# Patient Record
Sex: Female | Born: 1945
Health system: Southern US, Community
[De-identification: ages and names within clinical notes are randomized; demographics above are authoritative.]

## PROBLEM LIST (undated history)

## (undated) ENCOUNTER — Emergency Department (HOSPITAL_COMMUNITY): Payer: BC Managed Care – PPO | Source: Home / Self Care

## (undated) DIAGNOSIS — H3530 Unspecified macular degeneration: Secondary | ICD-10-CM

## (undated) DIAGNOSIS — M8580 Other specified disorders of bone density and structure, unspecified site: Secondary | ICD-10-CM

## (undated) DIAGNOSIS — E7800 Pure hypercholesterolemia, unspecified: Secondary | ICD-10-CM

## (undated) DIAGNOSIS — Z789 Other specified health status: Secondary | ICD-10-CM

## (undated) DIAGNOSIS — K635 Polyp of colon: Secondary | ICD-10-CM

## (undated) DIAGNOSIS — D649 Anemia, unspecified: Secondary | ICD-10-CM

## (undated) DIAGNOSIS — H269 Unspecified cataract: Secondary | ICD-10-CM

## (undated) DIAGNOSIS — A64 Unspecified sexually transmitted disease: Secondary | ICD-10-CM

## (undated) DIAGNOSIS — K5792 Diverticulitis of intestine, part unspecified, without perforation or abscess without bleeding: Secondary | ICD-10-CM

## (undated) HISTORY — DX: Anemia, unspecified: D64.9

## (undated) HISTORY — DX: Unspecified cataract: H26.9

## (undated) HISTORY — DX: Other specified disorders of bone density and structure, unspecified site: M85.80

## (undated) HISTORY — PX: COLONOSCOPY: SHX174

## (undated) HISTORY — PX: COSMETIC SURGERY: SHX468

## (undated) HISTORY — PX: BACK SURGERY: SHX140

## (undated) HISTORY — DX: Unspecified sexually transmitted disease: A64

## (undated) HISTORY — PX: CATARACT EXTRACTION: SUR2

## (undated) HISTORY — DX: Polyp of colon: K63.5

## (undated) HISTORY — DX: Unspecified macular degeneration: H35.30

## (undated) HISTORY — DX: Diverticulitis of intestine, part unspecified, without perforation or abscess without bleeding: K57.92

## (undated) HISTORY — DX: Pure hypercholesterolemia, unspecified: E78.00

---

## 1997-09-13 ENCOUNTER — Other Ambulatory Visit: Admission: RE | Admit: 1997-09-13 | Discharge: 1997-09-13 | Payer: Self-pay | Admitting: Obstetrics and Gynecology

## 1998-10-11 ENCOUNTER — Other Ambulatory Visit: Admission: RE | Admit: 1998-10-11 | Discharge: 1998-10-11 | Payer: Self-pay | Admitting: Obstetrics and Gynecology

## 1998-11-27 ENCOUNTER — Ambulatory Visit (HOSPITAL_COMMUNITY): Admission: RE | Admit: 1998-11-27 | Discharge: 1998-11-27 | Payer: Self-pay | Admitting: Neurosurgery

## 1998-11-27 ENCOUNTER — Encounter: Payer: Self-pay | Admitting: Neurosurgery

## 1999-10-16 ENCOUNTER — Other Ambulatory Visit: Admission: RE | Admit: 1999-10-16 | Discharge: 1999-10-16 | Payer: Self-pay | Admitting: Obstetrics and Gynecology

## 2000-06-09 ENCOUNTER — Ambulatory Visit (HOSPITAL_COMMUNITY): Admission: RE | Admit: 2000-06-09 | Discharge: 2000-06-09 | Payer: Self-pay | Admitting: Gastroenterology

## 2000-06-24 ENCOUNTER — Encounter: Payer: Self-pay | Admitting: Obstetrics and Gynecology

## 2000-06-24 ENCOUNTER — Encounter: Admission: RE | Admit: 2000-06-24 | Discharge: 2000-06-24 | Payer: Self-pay | Admitting: Obstetrics and Gynecology

## 2000-11-05 ENCOUNTER — Other Ambulatory Visit: Admission: RE | Admit: 2000-11-05 | Discharge: 2000-11-05 | Payer: Self-pay | Admitting: Obstetrics and Gynecology

## 2000-11-08 ENCOUNTER — Other Ambulatory Visit: Admission: RE | Admit: 2000-11-08 | Discharge: 2000-11-08 | Payer: Self-pay | Admitting: Obstetrics and Gynecology

## 2001-07-14 ENCOUNTER — Encounter: Admission: RE | Admit: 2001-07-14 | Discharge: 2001-07-14 | Payer: Self-pay | Admitting: Obstetrics and Gynecology

## 2001-07-14 ENCOUNTER — Encounter: Payer: Self-pay | Admitting: Obstetrics and Gynecology

## 2001-10-31 ENCOUNTER — Other Ambulatory Visit: Admission: RE | Admit: 2001-10-31 | Discharge: 2001-10-31 | Payer: Self-pay | Admitting: Obstetrics and Gynecology

## 2002-07-18 ENCOUNTER — Encounter: Admission: RE | Admit: 2002-07-18 | Discharge: 2002-07-18 | Payer: Self-pay | Admitting: Obstetrics and Gynecology

## 2002-07-18 ENCOUNTER — Encounter: Payer: Self-pay | Admitting: Obstetrics and Gynecology

## 2002-11-02 ENCOUNTER — Other Ambulatory Visit: Admission: RE | Admit: 2002-11-02 | Discharge: 2002-11-02 | Payer: Self-pay | Admitting: Obstetrics and Gynecology

## 2002-11-20 ENCOUNTER — Encounter: Admission: RE | Admit: 2002-11-20 | Discharge: 2002-11-20 | Payer: Self-pay | Admitting: Obstetrics and Gynecology

## 2002-11-20 ENCOUNTER — Encounter: Payer: Self-pay | Admitting: Obstetrics and Gynecology

## 2003-07-24 ENCOUNTER — Encounter: Admission: RE | Admit: 2003-07-24 | Discharge: 2003-07-24 | Payer: Self-pay | Admitting: Obstetrics and Gynecology

## 2003-11-12 ENCOUNTER — Other Ambulatory Visit: Admission: RE | Admit: 2003-11-12 | Discharge: 2003-11-12 | Payer: Self-pay | Admitting: Obstetrics and Gynecology

## 2004-07-18 ENCOUNTER — Ambulatory Visit (HOSPITAL_COMMUNITY): Admission: RE | Admit: 2004-07-18 | Discharge: 2004-07-18 | Payer: Self-pay | Admitting: Obstetrics and Gynecology

## 2004-11-12 ENCOUNTER — Other Ambulatory Visit: Admission: RE | Admit: 2004-11-12 | Discharge: 2004-11-12 | Payer: Self-pay | Admitting: *Deleted

## 2005-07-28 ENCOUNTER — Encounter: Admission: RE | Admit: 2005-07-28 | Discharge: 2005-07-28 | Payer: Self-pay | Admitting: Obstetrics and Gynecology

## 2005-08-13 ENCOUNTER — Encounter: Admission: RE | Admit: 2005-08-13 | Discharge: 2005-08-13 | Payer: Self-pay | Admitting: Obstetrics and Gynecology

## 2005-11-16 ENCOUNTER — Other Ambulatory Visit: Admission: RE | Admit: 2005-11-16 | Discharge: 2005-11-16 | Payer: Self-pay | Admitting: Obstetrics & Gynecology

## 2005-11-17 ENCOUNTER — Ambulatory Visit (HOSPITAL_COMMUNITY): Admission: RE | Admit: 2005-11-17 | Discharge: 2005-11-17 | Payer: Self-pay | Admitting: Obstetrics & Gynecology

## 2006-07-29 ENCOUNTER — Encounter: Admission: RE | Admit: 2006-07-29 | Discharge: 2006-07-29 | Payer: Self-pay | Admitting: Obstetrics & Gynecology

## 2006-11-19 ENCOUNTER — Other Ambulatory Visit: Admission: RE | Admit: 2006-11-19 | Discharge: 2006-11-19 | Payer: Self-pay | Admitting: Obstetrics & Gynecology

## 2007-08-11 ENCOUNTER — Encounter: Admission: RE | Admit: 2007-08-11 | Discharge: 2007-08-11 | Payer: Self-pay | Admitting: Obstetrics & Gynecology

## 2007-11-30 ENCOUNTER — Other Ambulatory Visit: Admission: RE | Admit: 2007-11-30 | Discharge: 2007-11-30 | Payer: Self-pay | Admitting: Obstetrics & Gynecology

## 2007-12-05 ENCOUNTER — Encounter: Admission: RE | Admit: 2007-12-05 | Discharge: 2007-12-05 | Payer: Self-pay | Admitting: Obstetrics & Gynecology

## 2008-08-13 ENCOUNTER — Encounter: Admission: RE | Admit: 2008-08-13 | Discharge: 2008-08-13 | Payer: Self-pay | Admitting: Obstetrics & Gynecology

## 2009-08-14 ENCOUNTER — Encounter: Admission: RE | Admit: 2009-08-14 | Discharge: 2009-08-14 | Payer: Self-pay | Admitting: Obstetrics & Gynecology

## 2010-08-15 ENCOUNTER — Encounter
Admission: RE | Admit: 2010-08-15 | Discharge: 2010-08-15 | Payer: Self-pay | Source: Home / Self Care | Attending: Internal Medicine | Admitting: Internal Medicine

## 2011-07-06 ENCOUNTER — Other Ambulatory Visit: Payer: Self-pay | Admitting: Internal Medicine

## 2011-07-06 DIAGNOSIS — Z1231 Encounter for screening mammogram for malignant neoplasm of breast: Secondary | ICD-10-CM

## 2011-08-18 ENCOUNTER — Ambulatory Visit
Admission: RE | Admit: 2011-08-18 | Discharge: 2011-08-18 | Disposition: A | Discharge status: Home / Self Care | Payer: BC Managed Care – PPO | Source: Ambulatory Visit | Attending: Internal Medicine | Admitting: Internal Medicine

## 2011-08-18 DIAGNOSIS — Z1231 Encounter for screening mammogram for malignant neoplasm of breast: Secondary | ICD-10-CM

## 2012-04-18 ENCOUNTER — Other Ambulatory Visit: Payer: Self-pay | Admitting: Internal Medicine

## 2012-04-18 DIAGNOSIS — M546 Pain in thoracic spine: Secondary | ICD-10-CM

## 2012-04-22 ENCOUNTER — Ambulatory Visit
Admission: RE | Admit: 2012-04-22 | Discharge: 2012-04-22 | Disposition: A | Discharge status: Home / Self Care | Payer: BC Managed Care – PPO | Source: Ambulatory Visit | Attending: Internal Medicine | Admitting: Internal Medicine

## 2012-04-22 DIAGNOSIS — M546 Pain in thoracic spine: Secondary | ICD-10-CM

## 2012-04-29 ENCOUNTER — Encounter (HOSPITAL_COMMUNITY): Payer: Self-pay | Admitting: Pharmacy Technician

## 2012-04-29 ENCOUNTER — Encounter (HOSPITAL_COMMUNITY): Payer: Self-pay | Admitting: *Deleted

## 2012-04-29 ENCOUNTER — Other Ambulatory Visit: Payer: Self-pay | Admitting: Internal Medicine

## 2012-04-29 ENCOUNTER — Other Ambulatory Visit: Payer: Self-pay | Admitting: Neurosurgery

## 2012-04-29 DIAGNOSIS — M8580 Other specified disorders of bone density and structure, unspecified site: Secondary | ICD-10-CM

## 2012-04-30 ENCOUNTER — Encounter (HOSPITAL_COMMUNITY)
Admission: RE | Disposition: A | Discharge status: Home / Self Care | Payer: Self-pay | Source: Ambulatory Visit | Attending: Neurosurgery

## 2012-04-30 ENCOUNTER — Observation Stay (HOSPITAL_COMMUNITY)
Admission: RE | Admit: 2012-04-30 | Discharge: 2012-05-01 | Disposition: A | Discharge status: Home / Self Care | Payer: BC Managed Care – PPO | Source: Ambulatory Visit | Attending: Neurosurgery | Admitting: Neurosurgery

## 2012-04-30 ENCOUNTER — Encounter (HOSPITAL_COMMUNITY): Payer: Self-pay | Admitting: *Deleted

## 2012-04-30 ENCOUNTER — Ambulatory Visit (HOSPITAL_COMMUNITY): Payer: BC Managed Care – PPO | Admitting: *Deleted

## 2012-04-30 ENCOUNTER — Ambulatory Visit (HOSPITAL_COMMUNITY): Payer: BC Managed Care – PPO

## 2012-04-30 DIAGNOSIS — M5124 Other intervertebral disc displacement, thoracic region: Principal | ICD-10-CM | POA: Insufficient documentation

## 2012-04-30 HISTORY — DX: Other specified health status: Z78.9

## 2012-04-30 LAB — CBC
HCT: 39 % (ref 36.0–46.0)
Hemoglobin: 13 g/dL (ref 12.0–15.0)
MCH: 30 pg (ref 26.0–34.0)
MCHC: 33.3 g/dL (ref 30.0–36.0)
MCV: 89.9 fL (ref 78.0–100.0)
Platelets: 434 10*3/uL — ABNORMAL HIGH (ref 150–400)
RBC: 4.34 MIL/uL (ref 3.87–5.11)
RDW: 12.8 % (ref 11.5–15.5)
WBC: 8.5 10*3/uL (ref 4.0–10.5)

## 2012-04-30 LAB — SURGICAL PCR SCREEN
MRSA, PCR: NEGATIVE
Staphylococcus aureus: NEGATIVE

## 2012-04-30 SURGERY — THORACIC DISCECTOMY
Anesthesia: General | Site: Back | Laterality: Left | Wound class: Clean

## 2012-04-30 MED ORDER — ACETAMINOPHEN 10 MG/ML IV SOLN
1000.0000 mg | Freq: Four times a day (QID) | INTRAVENOUS | Status: AC
  Administered 2012-04-30 – 2012-05-01 (×4): 1000 mg via INTRAVENOUS
  Filled 2012-04-30 (×7): qty 100

## 2012-04-30 MED ORDER — THROMBIN 5000 UNITS EX SOLR
CUTANEOUS | Status: DC | PRN
  Administered 2012-04-30 (×2): 5000 [IU] via TOPICAL

## 2012-04-30 MED ORDER — KCL IN DEXTROSE-NACL 20-5-0.45 MEQ/L-%-% IV SOLN
INTRAVENOUS | Status: DC
  Administered 2012-04-30: 999 mL via INTRAVENOUS
  Filled 2012-04-30 (×4): qty 1000

## 2012-04-30 MED ORDER — PROPOFOL 10 MG/ML IV BOLUS
INTRAVENOUS | Status: DC | PRN
  Administered 2012-04-30: 150 mg via INTRAVENOUS

## 2012-04-30 MED ORDER — VANCOMYCIN HCL IN DEXTROSE 1-5 GM/200ML-% IV SOLN
1000.0000 mg | Freq: Once | INTRAVENOUS | Status: AC
  Administered 2012-04-30: 1000 mg via INTRAVENOUS

## 2012-04-30 MED ORDER — MUPIROCIN 2 % EX OINT
TOPICAL_OINTMENT | Freq: Once | CUTANEOUS | Status: AC
  Administered 2012-04-30: 1 via NASAL
  Filled 2012-04-30: qty 22

## 2012-04-30 MED ORDER — OXYCODONE HCL 5 MG PO TABS: 5.0000 mg | ORAL_TABLET | ORAL | Status: DC | PRN

## 2012-04-30 MED ORDER — PROMETHAZINE HCL 25 MG/ML IJ SOLN
INTRAMUSCULAR | Status: AC
  Filled 2012-04-30: qty 1

## 2012-04-30 MED ORDER — ACETAMINOPHEN 650 MG RE SUPP: 650.0000 mg | RECTAL | Status: DC | PRN

## 2012-04-30 MED ORDER — LIDOCAINE-EPINEPHRINE 1 %-1:100000 IJ SOLN
INTRAMUSCULAR | Status: DC | PRN
  Administered 2012-04-30: 15 mL via INTRADERMAL

## 2012-04-30 MED ORDER — SODIUM CHLORIDE 0.9 % IR SOLN
Status: DC | PRN
  Administered 2012-04-30: 12:00:00

## 2012-04-30 MED ORDER — 0.9 % SODIUM CHLORIDE (POUR BTL) OPTIME
TOPICAL | Status: DC | PRN
  Administered 2012-04-30: 1000 mL

## 2012-04-30 MED ORDER — PROGESTERONE MICRONIZED 100 MG PO CAPS
100.0000 mg | ORAL_CAPSULE | Freq: Every day | ORAL | Status: DC
  Administered 2012-04-30: 100 mg via ORAL
  Filled 2012-04-30 (×2): qty 1

## 2012-04-30 MED ORDER — CYCLOBENZAPRINE HCL 10 MG PO TABS: 10.0000 mg | ORAL_TABLET | Freq: Three times a day (TID) | ORAL | Status: DC | PRN

## 2012-04-30 MED ORDER — SODIUM CHLORIDE 0.9 % IV SOLN
INTRAVENOUS | Status: AC
  Filled 2012-04-30: qty 500

## 2012-04-30 MED ORDER — ONDANSETRON HCL 4 MG/2ML IJ SOLN
INTRAMUSCULAR | Status: DC | PRN
  Administered 2012-04-30: 4 mg via INTRAVENOUS

## 2012-04-30 MED ORDER — HEMOSTATIC AGENTS (NO CHARGE) OPTIME
TOPICAL | Status: DC | PRN
  Administered 2012-04-30: 1 via TOPICAL

## 2012-04-30 MED ORDER — EPHEDRINE SULFATE 50 MG/ML IJ SOLN
INTRAMUSCULAR | Status: DC | PRN
  Administered 2012-04-30 (×3): 10 mg via INTRAVENOUS

## 2012-04-30 MED ORDER — HYDROXYZINE HCL 25 MG PO TABS: 50.0000 mg | ORAL_TABLET | ORAL | Status: DC | PRN

## 2012-04-30 MED ORDER — HYDROMORPHONE HCL PF 1 MG/ML IJ SOLN
INTRAMUSCULAR | Status: AC
  Filled 2012-04-30: qty 1

## 2012-04-30 MED ORDER — BUPIVACAINE HCL (PF) 0.5 % IJ SOLN
INTRAMUSCULAR | Status: DC | PRN
  Administered 2012-04-30: 15 mL

## 2012-04-30 MED ORDER — PHENYLEPHRINE HCL 10 MG/ML IJ SOLN
INTRAMUSCULAR | Status: DC | PRN
  Administered 2012-04-30 (×2): 120 ug via INTRAVENOUS
  Administered 2012-04-30 (×4): 80 ug via INTRAVENOUS

## 2012-04-30 MED ORDER — MIDAZOLAM HCL 5 MG/5ML IJ SOLN
INTRAMUSCULAR | Status: DC | PRN
  Administered 2012-04-30: 2 mg via INTRAVENOUS

## 2012-04-30 MED ORDER — FENTANYL CITRATE 0.05 MG/ML IJ SOLN
INTRAMUSCULAR | Status: DC | PRN
  Administered 2012-04-30: 250 ug via INTRAVENOUS
  Administered 2012-04-30 (×2): 50 ug via INTRAVENOUS

## 2012-04-30 MED ORDER — VANCOMYCIN HCL IN DEXTROSE 1-5 GM/200ML-% IV SOLN
INTRAVENOUS | Status: AC
  Filled 2012-04-30: qty 200

## 2012-04-30 MED ORDER — ESTRADIOL 1 MG PO TABS
0.5000 mg | ORAL_TABLET | Freq: Every day | ORAL | Status: DC
  Administered 2012-04-30: 0.5 mg via ORAL
  Filled 2012-04-30 (×2): qty 0.5

## 2012-04-30 MED ORDER — SODIUM CHLORIDE 0.9 % IJ SOLN: 3.0000 mL | INTRAMUSCULAR | Status: DC | PRN

## 2012-04-30 MED ORDER — ZOLPIDEM TARTRATE 5 MG PO TABS: 5.0000 mg | ORAL_TABLET | Freq: Every evening | ORAL | Status: DC | PRN

## 2012-04-30 MED ORDER — BACITRACIN 50000 UNITS IM SOLR
INTRAMUSCULAR | Status: AC
  Filled 2012-04-30: qty 1

## 2012-04-30 MED ORDER — ACETAMINOPHEN 10 MG/ML IV SOLN
INTRAVENOUS | Status: AC
  Filled 2012-04-30: qty 100

## 2012-04-30 MED ORDER — FAMOTIDINE IN NACL 20-0.9 MG/50ML-% IV SOLN: 20.0000 mg | Freq: Once | INTRAVENOUS | Status: DC

## 2012-04-30 MED ORDER — MORPHINE SULFATE 4 MG/ML IJ SOLN: 4.0000 mg | INTRAMUSCULAR | Status: DC | PRN

## 2012-04-30 MED ORDER — ALUM & MAG HYDROXIDE-SIMETH 200-200-20 MG/5ML PO SUSP: 30.0000 mL | Freq: Four times a day (QID) | ORAL | Status: DC | PRN

## 2012-04-30 MED ORDER — HYDROXYZINE HCL 50 MG/ML IM SOLN
50.0000 mg | INTRAMUSCULAR | Status: DC | PRN
  Filled 2012-04-30: qty 1

## 2012-04-30 MED ORDER — MENTHOL 3 MG MT LOZG: 1.0000 | LOZENGE | OROMUCOSAL | Status: DC | PRN

## 2012-04-30 MED ORDER — DEXAMETHASONE 4 MG PO TABS
4.0000 mg | ORAL_TABLET | Freq: Three times a day (TID) | ORAL | Status: AC
  Administered 2012-04-30 – 2012-05-01 (×3): 4 mg via ORAL
  Filled 2012-04-30 (×3): qty 1

## 2012-04-30 MED ORDER — SODIUM CHLORIDE 0.9 % IJ SOLN
3.0000 mL | Freq: Two times a day (BID) | INTRAMUSCULAR | Status: DC
  Administered 2012-04-30 (×2): 3 mL via INTRAVENOUS

## 2012-04-30 MED ORDER — DEXAMETHASONE SODIUM PHOSPHATE 10 MG/ML IJ SOLN
INTRAMUSCULAR | Status: AC
  Administered 2012-04-30: 10 mg via INTRAVENOUS
  Filled 2012-04-30: qty 1

## 2012-04-30 MED ORDER — PANTOPRAZOLE SODIUM 40 MG PO TBEC
40.0000 mg | DELAYED_RELEASE_TABLET | Freq: Every day | ORAL | Status: DC
  Administered 2012-04-30: 40 mg via ORAL
  Filled 2012-04-30 (×2): qty 1

## 2012-04-30 MED ORDER — GLYCOPYRROLATE 0.2 MG/ML IJ SOLN
INTRAMUSCULAR | Status: DC | PRN
  Administered 2012-04-30: .5 mg via INTRAVENOUS

## 2012-04-30 MED ORDER — KETOROLAC TROMETHAMINE 30 MG/ML IJ SOLN
30.0000 mg | Freq: Four times a day (QID) | INTRAMUSCULAR | Status: DC
  Administered 2012-04-30 – 2012-05-01 (×3): 30 mg via INTRAVENOUS
  Filled 2012-04-30 (×7): qty 1

## 2012-04-30 MED ORDER — LIDOCAINE HCL (CARDIAC) 20 MG/ML IV SOLN
INTRAVENOUS | Status: DC | PRN
  Administered 2012-04-30: 25 mg via INTRAVENOUS

## 2012-04-30 MED ORDER — PHENOL 1.4 % MT LIQD: 1.0000 | OROMUCOSAL | Status: DC | PRN

## 2012-04-30 MED ORDER — NEOSTIGMINE METHYLSULFATE 1 MG/ML IJ SOLN
INTRAMUSCULAR | Status: DC | PRN
  Administered 2012-04-30: 2.5 mg via INTRAVENOUS

## 2012-04-30 MED ORDER — GENTAMICIN IN SALINE 1.6-0.9 MG/ML-% IV SOLN
80.0000 mg | Freq: Once | INTRAVENOUS | Status: AC
  Administered 2012-04-30: 80 mg via INTRAVENOUS
  Filled 2012-04-30: qty 50

## 2012-04-30 MED ORDER — MIDAZOLAM HCL 2 MG/2ML IJ SOLN: 0.5000 mg | Freq: Once | INTRAMUSCULAR | Status: DC | PRN

## 2012-04-30 MED ORDER — MAGNESIUM HYDROXIDE 400 MG/5ML PO SUSP: 30.0000 mL | Freq: Every day | ORAL | Status: DC | PRN

## 2012-04-30 MED ORDER — LACTATED RINGERS IV SOLN
INTRAVENOUS | Status: DC | PRN
  Administered 2012-04-30 (×2): via INTRAVENOUS

## 2012-04-30 MED ORDER — FAMOTIDINE IN NACL 20-0.9 MG/50ML-% IV SOLN
20.0000 mg | Freq: Once | INTRAVENOUS | Status: AC
  Administered 2012-04-30: 20 mg via INTRAVENOUS
  Filled 2012-04-30: qty 50

## 2012-04-30 MED ORDER — PROMETHAZINE HCL 25 MG/ML IJ SOLN
6.2500 mg | INTRAMUSCULAR | Status: DC | PRN
  Administered 2012-04-30: 6.25 mg via INTRAVENOUS

## 2012-04-30 MED ORDER — MEPERIDINE HCL 25 MG/ML IJ SOLN: 6.2500 mg | INTRAMUSCULAR | Status: DC | PRN

## 2012-04-30 MED ORDER — KETOROLAC TROMETHAMINE 30 MG/ML IJ SOLN
30.0000 mg | Freq: Once | INTRAMUSCULAR | Status: AC
  Administered 2012-04-30: 30 mg via INTRAVENOUS

## 2012-04-30 MED ORDER — KETOROLAC TROMETHAMINE 30 MG/ML IJ SOLN
INTRAMUSCULAR | Status: AC
  Filled 2012-04-30: qty 1

## 2012-04-30 MED ORDER — SODIUM CHLORIDE 0.9 % IV SOLN: 250.0000 mL | INTRAVENOUS | Status: DC

## 2012-04-30 MED ORDER — BISACODYL 10 MG RE SUPP: 10.0000 mg | Freq: Every day | RECTAL | Status: DC | PRN

## 2012-04-30 MED ORDER — HYDROMORPHONE HCL PF 1 MG/ML IJ SOLN
0.2500 mg | INTRAMUSCULAR | Status: DC | PRN
  Administered 2012-04-30: 0.25 mg via INTRAVENOUS

## 2012-04-30 MED ORDER — ZOLPIDEM TARTRATE 10 MG PO TABS: 10.0000 mg | ORAL_TABLET | Freq: Every evening | ORAL | Status: DC | PRN

## 2012-04-30 MED ORDER — ROCURONIUM BROMIDE 100 MG/10ML IV SOLN
INTRAVENOUS | Status: DC | PRN
  Administered 2012-04-30: 50 mg via INTRAVENOUS
  Administered 2012-04-30 (×2): 10 mg via INTRAVENOUS

## 2012-04-30 MED ORDER — KCL IN DEXTROSE-NACL 20-5-0.45 MEQ/L-%-% IV SOLN
INTRAVENOUS | Status: AC
  Filled 2012-04-30: qty 1000

## 2012-04-30 MED ORDER — ACETAMINOPHEN 325 MG PO TABS: 650.0000 mg | ORAL_TABLET | ORAL | Status: DC | PRN

## 2012-04-30 SURGICAL SUPPLY — 52 items
ADH SKN CLS APL DERMABOND .7 (GAUZE/BANDAGES/DRESSINGS) ×1
APL SKNCLS STERI-STRIP NONHPOA (GAUZE/BANDAGES/DRESSINGS)
BAG DECANTER FOR FLEXI CONT (MISCELLANEOUS) ×2 IMPLANT
BENZOIN TINCTURE PRP APPL 2/3 (GAUZE/BANDAGES/DRESSINGS) IMPLANT
BIT DRILL NEURO 2X3.1 SFT TUCH (MISCELLANEOUS) ×1 IMPLANT
BLADE SURG ROTATE 9660 (MISCELLANEOUS) IMPLANT
BUR ROUND FLUTED 5 RND (BURR) ×2 IMPLANT
CANISTER SUCTION 2500CC (MISCELLANEOUS) ×2 IMPLANT
CLOTH BEACON ORANGE TIMEOUT ST (SAFETY) ×2 IMPLANT
CONT SPEC 4OZ CLIKSEAL STRL BL (MISCELLANEOUS) IMPLANT
DERMABOND ADVANCED (GAUZE/BANDAGES/DRESSINGS) ×1
DERMABOND ADVANCED .7 DNX12 (GAUZE/BANDAGES/DRESSINGS) ×1 IMPLANT
DRAPE LAPAROTOMY 100X72X124 (DRAPES) ×2 IMPLANT
DRAPE MICROSCOPE LEICA (MISCELLANEOUS) ×2 IMPLANT
DRAPE POUCH INSTRU U-SHP 10X18 (DRAPES) ×2 IMPLANT
DRILL NEURO 2X3.1 SOFT TOUCH (MISCELLANEOUS) ×2
ELECT REM PT RETURN 9FT ADLT (ELECTROSURGICAL) ×2
ELECTRODE REM PT RTRN 9FT ADLT (ELECTROSURGICAL) ×1 IMPLANT
GAUZE SPONGE 4X4 16PLY XRAY LF (GAUZE/BANDAGES/DRESSINGS) IMPLANT
GLOVE BIOGEL M 6.5 STRL (GLOVE) ×1 IMPLANT
GLOVE BIOGEL M 7.0 STRL (GLOVE) ×2 IMPLANT
GLOVE BIOGEL PI IND STRL 8 (GLOVE) ×1 IMPLANT
GLOVE BIOGEL PI INDICATOR 8 (GLOVE) ×1
GLOVE ECLIPSE 7.5 STRL STRAW (GLOVE) ×2 IMPLANT
GLOVE EXAM NITRILE LRG STRL (GLOVE) IMPLANT
GLOVE EXAM NITRILE MD LF STRL (GLOVE) IMPLANT
GLOVE EXAM NITRILE XL STR (GLOVE) IMPLANT
GLOVE EXAM NITRILE XS STR PU (GLOVE) IMPLANT
GOWN BRE IMP SLV AUR LG STRL (GOWN DISPOSABLE) ×1 IMPLANT
GOWN BRE IMP SLV AUR XL STRL (GOWN DISPOSABLE) ×2 IMPLANT
GOWN STRL REIN 2XL LVL4 (GOWN DISPOSABLE) IMPLANT
KIT BASIN OR (CUSTOM PROCEDURE TRAY) ×2 IMPLANT
KIT ROOM TURNOVER OR (KITS) ×2 IMPLANT
NDL SPNL 22GX3.5 QUINCKE BK (NEEDLE) ×1 IMPLANT
NEEDLE SPNL 22GX3.5 QUINCKE BK (NEEDLE) ×2 IMPLANT
NS IRRIG 1000ML POUR BTL (IV SOLUTION) ×2 IMPLANT
PACK LAMINECTOMY NEURO (CUSTOM PROCEDURE TRAY) ×2 IMPLANT
PAD ARMBOARD 7.5X6 YLW CONV (MISCELLANEOUS) ×6 IMPLANT
SLEEVE SURGEON STRL (DRAPES) ×2 IMPLANT
SPONGE GAUZE 4X4 12PLY (GAUZE/BANDAGES/DRESSINGS) IMPLANT
SPONGE SURGIFOAM ABS GEL SZ50 (HEMOSTASIS) IMPLANT
STAPLER SKIN PROX WIDE 3.9 (STAPLE) IMPLANT
STRIP CLOSURE SKIN 1/2X4 (GAUZE/BANDAGES/DRESSINGS) IMPLANT
SUT VIC AB 0 CT1 18XCR BRD8 (SUTURE) ×1 IMPLANT
SUT VIC AB 0 CT1 8-18 (SUTURE) ×2
SUT VIC AB 2-0 CP2 18 (SUTURE) ×2 IMPLANT
SUT VIC AB 2-0 CT1 18 (SUTURE) ×1 IMPLANT
SUT VIC AB 3-0 SH 8-18 (SUTURE) ×2 IMPLANT
SYR 20CC LL (SYRINGE) ×2 IMPLANT
TOWEL OR 17X24 6PK STRL BLUE (TOWEL DISPOSABLE) ×2 IMPLANT
TOWEL OR 17X26 10 PK STRL BLUE (TOWEL DISPOSABLE) ×2 IMPLANT
WATER STERILE IRR 1000ML POUR (IV SOLUTION) ×2 IMPLANT

## 2012-04-30 NOTE — Transfer of Care (Signed)
Immediate Anesthesia Transfer of Care Note  Patient: Yolanda Nicholson  Procedure(s) Performed: Procedure(s) (LRB) with comments: THORACIC DISCECTOMY (Left) - Left Thoracic 1-2 Thoracic laminotomy and microdiskectomy  Patient Location: PACU  Anesthesia Type: General  Level of Consciousness: awake, alert  and oriented  Airway & Oxygen Therapy: Patient Spontanous Breathing and Patient connected to face mask oxygen  Post-op Assessment: Report given to PACU RN, Post -op Vital signs reviewed and stable and Patient moving all extremities X 4  Post vital signs: Reviewed and stable  Complications: No apparent anesthesia complications

## 2012-04-30 NOTE — Anesthesia Preprocedure Evaluation (Signed)
Anesthesia Evaluation  Patient identified by MRN, date of birth, ID band Patient awake    Reviewed: Allergy & Precautions, H&P , NPO status , Patient's Chart, lab work & pertinent test results  History of Anesthesia Complications Negative for: history of anesthetic complications  Airway Mallampati: I TM Distance: >3 FB Neck ROM: Full    Dental  (+) Teeth Intact and Dental Advisory Given   Pulmonary neg pulmonary ROS,  breath sounds clear to auscultation  Pulmonary exam normal       Cardiovascular negative cardio ROS  Rhythm:Regular Rate:Normal     Neuro/Psych negative neurological ROS     GI/Hepatic negative GI ROS, Neg liver ROS,   Endo/Other  negative endocrine ROS  Renal/GU negative Renal ROS     Musculoskeletal   Abdominal   Peds  Hematology   Anesthesia Other Findings   Reproductive/Obstetrics                           Anesthesia Physical Anesthesia Plan  ASA: I  Anesthesia Plan: General   Post-op Pain Management:    Induction: Intravenous  Airway Management Planned: Oral ETT  Additional Equipment:   Intra-op Plan:   Post-operative Plan: Extubation in OR  Informed Consent: I have reviewed the patients History and Physical, chart, labs and discussed the procedure including the risks, benefits and alternatives for the proposed anesthesia with the patient or authorized representative who has indicated his/her understanding and acceptance.   Dental advisory given  Plan Discussed with: CRNA and Surgeon  Anesthesia Plan Comments: (Plan routine monitors, GETA)        Anesthesia Quick Evaluation

## 2012-04-30 NOTE — Preoperative (Signed)
Beta Blockers   Reason not to administer Beta Blockers:Not Applicable 

## 2012-04-30 NOTE — Anesthesia Postprocedure Evaluation (Signed)
  Anesthesia Post-op Note  Patient: Yolanda Nicholson  Procedure(s) Performed: Procedure(s) (LRB) with comments: THORACIC DISCECTOMY (Left) - Left Thoracic 1-2 Thoracic laminotomy and microdiskectomy  Patient Location: PACU  Anesthesia Type: General  Level of Consciousness: awake, alert , oriented and patient cooperative  Airway and Oxygen Therapy: Patient Spontanous Breathing  Post-op Pain: mild  Post-op Assessment: Post-op Vital signs reviewed, Patient's Cardiovascular Status Stable, Respiratory Function Stable, Patent Airway, No signs of Nausea or vomiting and Pain level controlled  Post-op Vital Signs: Reviewed and stable  Complications: No apparent anesthesia complications

## 2012-04-30 NOTE — H&P (Signed)
Subjective: Patient is a 66 y.o. female who is admitted for treatment of left T1-T2 thoracic disc herniation, with resulting left T1 radiculopathy. Symptoms began 3 weeks ago, while the patient was doing yoga. Pain was initially severe, however over the past week the pain in his knees but she developed weakness in her left hand.  The pain has been along the medial inferior left scapula as well as the left axilla and medial left arm with tingling through the left hand, and a sense of weakness in left hand, with difficulty using it, such as a tear open packages and so on.  MRI scan revealed a large left T1-T2 thoracic disc herniation and the patient is admitted now for a left T1-T2 thoracic laminotomy and microdiscectomy.    Past Medical History  Diagnosis Date  . No pertinent past medical history     Past Surgical History  Procedure Date  . Cosmetic surgery     Prescriptions prior to admission  Medication Sig Dispense Refill  . Biotin 5000 MCG TABS Take 1 tablet by mouth daily.      . Calcium Carbonate-Vitamin D (CALCIUM 600 + D PO) Take 2 tablets by mouth daily.      . Cholecalciferol (VITAMIN D) 2000 UNITS tablet Take 2,000 Units by mouth daily. Doesn't take often if she takes the calcium+d      . estradiol (ESTRACE) 0.5 MG tablet Take 0.5 mg by mouth daily.      . progesterone (PROMETRIUM) 100 MG capsule Take 100 mg by mouth daily.       Allergies  Allergen Reactions  . Penicillins Rash  . Sulfa Antibiotics Rash    History  Substance Use Topics  . Smoking status: Former Games developer  . Smokeless tobacco: Not on file  . Alcohol Use: 8.4 oz/week    14 Glasses of wine per week    History reviewed. No pertinent family history.   Review of Systems A comprehensive review of systems was negative.  Objective: Vital signs in last 24 hours: Temp:  [97.7 F (36.5 C)] 97.7 F (36.5 C) (10/05 0715) Pulse Rate:  [78] 78  (10/05 0715) Resp:  [18] 18  (10/05 0715) BP: (130)/(73) 130/73  mmHg (10/05 0715) SpO2:  [100 %] 100 % (10/05 0715) Weight:  [53.156 kg (117 lb 3 oz)-53.978 kg (119 lb)] 53.156 kg (117 lb 3 oz) (10/05 0740)  EXAM: Patient is a well-developed well-nourished white female in no acute distress. Lungs are clear to auscultation , the patient has symmetrical respiratory excursion. Heart has a regular rate and rhythm normal S1 and S2 no murmur.   Abdomen is soft nontender nondistended bowel sounds are present. Extremity examination shows no clubbing cyanosis or edema. Musculoskeletal examination shows no tenderness to palpation over the cervical or thoracic spinous processes, or paracervical or parathoracic musculature. She is a forward motion in that without discomfort her range of motion neck. Neurologic examination shows 5 or 5 strength to the deltoid, biceps, and triceps bilaterally, as well as in the right intrinsics and grip. Her the left intrinsics are 4/5 in the left grip is 4 minus over 5. Lower extremity strength is 5 over 5 in the iliopsoas, quadriceps, dorsiflexion, EHL, and plantar flexor bilaterally. Sensation is intact to pinprick in the digits of the upper extremities, as well as in the distal lower extremities. Reflexes are 2 in the biceps and brachial radialis, minimal in the triceps, one in the quadriceps, trace in the gastrocnemius, they are all symmetrical bilaterally.  Toes are downgoing bilaterally. She has a normal gait and stance.  Data Review:CBC    Component Value Date/Time   WBC 8.5 04/30/2012 0726   RBC 4.34 04/30/2012 0726   HGB 13.0 04/30/2012 0726   HCT 39.0 04/30/2012 0726   PLT 434* 04/30/2012 0726   MCV 89.9 04/30/2012 0726   MCH 30.0 04/30/2012 0726   MCHC 33.3 04/30/2012 0726   RDW 12.8 04/30/2012 0726     Assessment/Plan: Patient with a left T1-T2 thoracic disc herniation with significant left T1 thoracic radiculopathy with weakness of the left intrinsics and grip. She is admitted for a left T1-T2 thoracic laminotomy and  microdiscectomy.  I've discussed with the patient the nature of his condition, the nature the surgical procedure, the typical length of surgery, hospital stay, and overall recuperation. We discussed limitations postoperatively. I discussed risks of surgery including risks of infection, bleeding, possibly need for transfusion, the risk of nerve root dysfunction with pain, weakness, numbness, or paresthesias, the risk of spinal cord dysfunction with paralysis of the extremities as well as bowel and bladder, and the risk of dural tear and CSF leakage and possible need for further surgery, the risk of recurrent disc herniation and the possible need for further surgery, and the risk of anesthetic complications including myocardial infarction, stroke, pneumonia, and death. Understanding all this the patient does wish to proceed with surgery and is admitted for such.    Hewitt Shorts, MD 04/30/2012 9:06 AM

## 2012-04-30 NOTE — Anesthesia Procedure Notes (Signed)
Procedure Name: Intubation Date/Time: 04/30/2012 10:30 AM Performed by: Quentin Ore Pre-anesthesia Checklist: Patient identified, Emergency Drugs available, Suction available, Patient being monitored and Timeout performed Patient Re-evaluated:Patient Re-evaluated prior to inductionOxygen Delivery Method: Circle system utilized Preoxygenation: Pre-oxygenation with 100% oxygen Intubation Type: IV induction Ventilation: Mask ventilation without difficulty Laryngoscope Size: Mac and 3 Grade View: Grade I Tube type: Oral Tube size: 7.0 mm Number of attempts: 1 Airway Equipment and Method: Stylet Placement Confirmation: ETT inserted through vocal cords under direct vision,  positive ETCO2 and breath sounds checked- equal and bilateral Secured at: 21 cm Tube secured with: Tape Dental Injury: Teeth and Oropharynx as per pre-operative assessment and Injury to lip

## 2012-04-30 NOTE — Progress Notes (Signed)
Filed Vitals:   04/30/12 1400 04/30/12 1415 04/30/12 1430 04/30/12 1455  BP: 119/68 119/64 112/63 123/72  Pulse: 70 71 67 83  Temp:    97.6 F (36.4 C)  TempSrc:    Oral  Resp: 16 12 12 18   Height:      Weight:    56.9 kg (125 lb 7.1 oz)  SpO2: 95% 97% 97% 98%    Patient resting in bed. Comfortable. Wound clean and dry. Moving all 4 extremities well, but still weakness in left intrinsics and grip. Has been out of bed to the bathroom. Has voided. Has not yet ambulate in the halls. Limited appetite and by mouth intake.  Plan: Encouraged patient to ambulate in the halls at least twice this evening, and again in the morning. Discussed discharge instructions with the patient and her daughter.  Hewitt Shorts, MD 04/30/2012, 5:43 PM

## 2012-04-30 NOTE — Progress Notes (Signed)
Filed Vitals:   04/29/12 1703 04/30/12 0715 04/30/12 0740 04/30/12 1313  BP:  130/73    Pulse:  78  73  Temp:  97.7 F (36.5 C)  97.6 F (36.4 C)  TempSrc:  Oral    Resp:  18  16  Height: 5' 3.5" (1.613 m)  5' 3.5" (1.613 m)   Weight: 53.978 kg (119 lb)  53.156 kg (117 lb 3 oz)   SpO2:  100%  100%    CBC  Basename 04/30/12 0726  WBC 8.5  HGB 13.0  HCT 39.0  PLT 434*    Patient resting comfortably in recovery room. Wound clean and dry. Moving all 4 extremities well, but weakness in left hand persists.  Plan: We'll progress through postoperative recovery. Spoke with patient's husband about surgery, intraoperative findings, and postoperative/discharge instructions. His questions were answered.  Hewitt Shorts, MD 04/30/2012, 1:26 PM

## 2012-04-30 NOTE — Op Note (Signed)
04/30/2012  12:57 PM  PATIENT:  Yolanda Nicholson  66 y.o. female  PRE-OPERATIVE DIAGNOSIS:  Left Thoracic one-two HNP and  Thoracic radiculopathy  POST-OPERATIVE DIAGNOSIS:  Left Thoracic one-two HNP and  Thoracic radiculopathy  PROCEDURE:  Procedure(s): THORACIC DISCECTOMY: Left T1 T2 thoracic laminotomy, foraminotomy, and microdiscectomy, with microdissection, microsurgical technique, and the operating microscope  SURGEON:  Surgeon(s): Hewitt Shorts, MD Clydene Fake, MD  ASSISTANTS: Colon Branch, M.D.  ANESTHESIA:   general  EBL:  Total I/O In: 1000 [I.V.:1000] Out: 50 [Blood:50]  BLOOD ADMINISTERED:none  COUNT: Correct per nursing staff  DICTATION: Patient was brought to the operating room, placed under general endotracheal anesthesia. The radiolucent 3 pin Mayfield head holder was applied to the head and the patient was turned to a prone position, and secured with the radiolucent Mayfield head holder. The neck and upper back were prepped with Betadine soap and solution , and draped in sterile fashion. C-arm fluoroscope was used for localization, the T1-2 level was identified, the midline was infiltrated with local anesthetic with epinephrine, and a midline incision made over the T1-2 level and carried down through the subcutaneous tissue. Bipolar cautery and electrocautery were used to maintain hemostasis. Dissection was carried down to the thoracic fascia, which was incised on the left side of the midline and the parathoracic musculature was dissected from the spinous process and lamina in a subperiosteal fashion. Additional imaging with the C-arm fluoroscope was used to identify the T1-2 intralaminar space. The operating microscope was draped and brought into the field to provided additional magnification, illumination, and visualization, and the remainder the procedure was performed using microdissection and microsurgical technique. Laminotomy was begun using the  high-speed drill, edges of the bone were waxed as needed. The laminotomy was continued with the 2 mm Kerrison punch with a thin footplate. And then the decompression was extended laterally into the left T1-2 to neural foramen. Final localization was once again done with the C-arm fluoroscope. We then began to explore the epidural space. The T1-2 disc space was identified, and then rostral to that the exiting T1 nerve root was identified. Ventral to the nerve root, and just above the disc space level, we encountered a free fragment that was compressing the ventral thecal sac and nerve root. This was removed in a piecemeal fashion, decompressing the exiting nerve root and thecal sac. We were able to mobilize additional herniated disc from within the annulus of the disc, and this was removed as well. In the end all loose fragments of disc material removed from the epidural space and good decompression of the thecal sac and exiting left T1 nerve root was achieved. We then establish hemostasis with the use of Gelfoam with thrombin, and waxing of the bone edges. Once hemostasis was confirmed we proceeded with closure. Deep fascia was closed with interrupted undyed 0 Vicryl sutures, Scarpa's fascia was closed with with interrupted inverted 2-0 undyed Vicryl sutures, the subcutaneous and subcuticular layers were closed with interrupted inverted 2-0 Vicryl sutures, skin edges were approximate Dermabond. Following surgery the patient was turned back to a supine position, the 3 pin Mayfield head holder was removed, and the patient is to be reversed and the anesthetic, extubated, and transferred to the recovery room for further care. Patient tolerated the procedure well.  PLAN OF CARE: Admit for overnight observation  PATIENT DISPOSITION:  PACU - hemodynamically stable.   Delay start of Pharmacological VTE agent (>24hrs) due to surgical blood loss or risk of bleeding:  yes    In zone 2

## 2012-05-01 MED ORDER — CYCLOBENZAPRINE HCL 10 MG PO TABS: 10.0000 mg | ORAL_TABLET | Freq: Three times a day (TID) | ORAL | Status: DC | PRN

## 2012-05-01 NOTE — Discharge Summary (Signed)
Physician Discharge Summary  Patient ID: Yolanda Nicholson MRN: [id number] DOB/AGE: 1946-05-26 66 y.o.  Admit date: 04/30/2012 Discharge date: 05/01/2012  Admission Diagnoses:Left Thoracic one-two HNP and Thoracic radiculopathy   Discharge Diagnoses: Left Thoracic one-two HNP and Thoracic radiculopathy  Active Problems:  * No active hospital problems. *    Discharged Condition: good  Hospital Course: pt admitted on day of surgery, underwent procedure below - pt with much less arm pain and numbness - minimal neck pain  - ambulating, eating, voiding  Consults: None  Significant Diagnostic Studies: none  Treatments: surgery: THORACIC DISCECTOMY: Left T1 T2 thoracic laminotomy, foraminotomy, and microdiscectomy, with microdissection, microsurgical technique, and the operating microscope   Discharge Exam: Blood pressure 123/70, pulse 71, temperature 97.7 F (36.5 C), temperature source Oral, resp. rate 18, height 5' 3.5" (1.613 m), weight 56.9 kg (125 lb 7.1 oz), SpO2 100.00%. Wound:c/d/i  Disposition: home  Discharge Orders    Future Appointments: Provider: Department: Dept Phone: Center:   05/04/2012 10:30 AM [facility] Dexa 1 [facility] [id number] [facility]       Medication List     As of 05/01/2012  9:05 AM    TAKE these medications         Biotin 5000 MCG Tabs   Take 1 tablet by mouth daily.      CALCIUM 600 + D PO   Take 2 tablets by mouth daily.      cyclobenzaprine 10 MG tablet   Commonly known as: FLEXERIL   Take 1 tablet (10 mg total) by mouth 3 (three) times daily as needed for muscle spasms.      estradiol 0.5 MG tablet   Commonly known as: ESTRACE   Take 0.5 mg by mouth daily.      progesterone 100 MG capsule   Commonly known as: PROMETRIUM   Take 100 mg by mouth daily.      Vitamin D 2000 UNITS tablet   Take 2,000 Units by mouth daily. Doesn't take often if she takes the calcium+d       vicodin 1-2 PO Q4-6 hours  prn  Signed: Clydene Fake, MD 05/01/2012, 9:05 AM

## 2012-05-01 NOTE — Plan of Care (Signed)
Problem: Phase I Progression Outcomes Goal: OOB as tolerated unless otherwise ordered Outcome: Completed/Met Date Met:  04/30/12 Patient up ambulating in hallway with family.  Gait is steady. Goal: PT/OT consults requested Outcome: Completed/Met Date Met:  05/01/12 Therapies ordered for post operatively.  Problem: Phase II Progression Outcomes Goal: Progress activity as tolerated unless otherwise ordered Outcome: Completed/Met Date Met:  05/01/12 Patient able to be up ambulating in room with minimal assist. Goal: Discharge plan established Outcome: Completed/Met Date Met:  04/30/12 Patient to be discharged to home with family tomorrow. Goal: Tolerating diet Outcome: Completed/Met Date Met:  05/01/12 Able to tolerate diet without any nausea or vomiting noted or reported.

## 2012-05-01 NOTE — Plan of Care (Signed)
Problem: Discharge Progression Outcomes Goal: Discharge plan in place and appropriate Outcome: Completed/Met Date Met:  05/01/12 Patient discharge instructions received. Goal: Pain controlled with appropriate interventions Outcome: Completed/Met Date Met:  05/01/12 Patient had received tylenol iv and toradol for swelling and discomfort.  Comfort verbalized with current strategies. Goal: Hemodynamically stable Outcome: Completed/Met Date Met:  05/01/12 Vitals stable. Goal: Tolerates diet Outcome: Completed/Met Date Met:  05/01/12 Tolerating ordered diet with no difficulties. Goal: Incision without S/S infection Outcome: Completed/Met Date Met:  05/01/12 Family instructed to view incision at time of discharge to determine if she would have potential problems after discharge. Goal: Ambulates without assistance Outcome: Completed/Met Date Met:  05/01/12 Patient able to freely ambulate around room with no assistance.

## 2012-05-04 ENCOUNTER — Other Ambulatory Visit: Payer: BC Managed Care – PPO

## 2012-05-17 ENCOUNTER — Ambulatory Visit
Admission: RE | Admit: 2012-05-17 | Discharge: 2012-05-17 | Disposition: A | Discharge status: Home / Self Care | Payer: BC Managed Care – PPO | Source: Ambulatory Visit | Attending: Internal Medicine | Admitting: Internal Medicine

## 2012-05-17 DIAGNOSIS — M8580 Other specified disorders of bone density and structure, unspecified site: Secondary | ICD-10-CM

## 2012-07-08 ENCOUNTER — Other Ambulatory Visit: Payer: Self-pay | Admitting: Internal Medicine

## 2012-07-08 DIAGNOSIS — Z1231 Encounter for screening mammogram for malignant neoplasm of breast: Secondary | ICD-10-CM

## 2012-08-18 ENCOUNTER — Ambulatory Visit
Admission: RE | Admit: 2012-08-18 | Discharge: 2012-08-18 | Disposition: A | Discharge status: Home / Self Care | Payer: BC Managed Care – PPO | Source: Ambulatory Visit | Attending: Internal Medicine | Admitting: Internal Medicine

## 2012-08-18 DIAGNOSIS — Z1231 Encounter for screening mammogram for malignant neoplasm of breast: Secondary | ICD-10-CM

## 2013-01-13 ENCOUNTER — Encounter: Payer: Self-pay | Admitting: Certified Nurse Midwife

## 2013-01-16 ENCOUNTER — Encounter: Payer: Self-pay | Admitting: Certified Nurse Midwife

## 2013-01-16 ENCOUNTER — Ambulatory Visit (INDEPENDENT_AMBULATORY_CARE_PROVIDER_SITE_OTHER): Payer: BC Managed Care – PPO | Admitting: Certified Nurse Midwife

## 2013-01-16 VITALS — BP 100/64 | HR 64 | Resp 16 | Ht 63.25 in | Wt 122.0 lb

## 2013-01-16 DIAGNOSIS — Z0000 Encounter for general adult medical examination without abnormal findings: Secondary | ICD-10-CM

## 2013-01-16 DIAGNOSIS — N951 Menopausal and female climacteric states: Secondary | ICD-10-CM

## 2013-01-16 DIAGNOSIS — Z01419 Encounter for gynecological examination (general) (routine) without abnormal findings: Secondary | ICD-10-CM

## 2013-01-16 LAB — POCT URINALYSIS DIPSTICK
Bilirubin, UA: NEGATIVE
Blood, UA: NEGATIVE
Glucose, UA: NEGATIVE
Ketones, UA: NEGATIVE
pH, UA: 5

## 2013-01-16 MED ORDER — PROGESTERONE MICRONIZED 100 MG PO CAPS: 100.0000 mg | ORAL_CAPSULE | Freq: Every day | ORAL | Status: DC

## 2013-01-16 MED ORDER — ESTRADIOL 0.5 MG PO TABS: 0.5000 mg | ORAL_TABLET | Freq: Every day | ORAL | Status: DC

## 2013-01-16 NOTE — Progress Notes (Signed)
67 y.o. G32P2002 Married Caucasian Fe here for annual exam. Menopausal on HRT, denies no vaginal bleeding or vaginal dryness. Sees PCP for aex and labs. All "normal per patient. Patient had lower back surgery 2013, doing well, still in physical therapy. No health issues today. Desires HRT continuance, feels she is well informed as to risks and benefits, and has no other health problems.   Patient's last menstrual period was 07/27/2001.          Sexually active: yes  The current method of family planning is vasectomy.    Exercising: yes  spin, walk & yoga Smoker:  no  Health Maintenance: Pap: 01-13-12 neg HPV HR neg MMG:  08-18-12 normal Colonoscopy:  2011 BMD:   05-17-12 TDaP:  5/11 Labs: Poct urine-neg Self breast exam: monthly   reports that she has quit smoking. She does not have any smokeless tobacco history on file. She reports that she drinks about 4.2 ounces of alcohol per week. She reports that she does not use illicit drugs.  Past Medical History  Diagnosis Date  . No pertinent past medical history   . Anemia     Past Surgical History  Procedure Laterality Date  . Cosmetic surgery      Current Outpatient Prescriptions  Medication Sig Dispense Refill  . Biotin 5000 MCG TABS Take 1 tablet by mouth daily.      . Calcium Carbonate-Vitamin D (CALCIUM 600 + D PO) Take 2 tablets by mouth daily.      . Cholecalciferol (VITAMIN D) 2000 UNITS tablet Take 2,000 Units by mouth daily. Doesn't take often if she takes the calcium+d      . estradiol (ESTRACE) 0.5 MG tablet Take 0.5 mg by mouth daily.      . fluocinonide cream (LIDEX) 0.05 % 2 (two) times daily.      . progesterone (PROMETRIUM) 100 MG capsule Take 100 mg by mouth daily.       No current facility-administered medications for this visit.    Family History  Problem Relation Age of Onset  . Cancer Father     colon  . Hypertension Father   . Stroke Father     ROS:  Pertinent items are noted in HPI.  Otherwise, a  comprehensive ROS was negative.  Exam:   BP 100/64  Pulse 64  Resp 16  Ht 5' 3.25" (1.607 m)  Wt 122 lb (55.339 kg)  BMI 21.43 kg/m2  LMP 07/27/2001 Height: 5' 3.25" (160.7 cm)  Ht Readings from Last 3 Encounters:  01/16/13 5' 3.25" (1.607 m)  04/30/12 5' 3.5" (1.613 m)  04/30/12 5' 3.5" (1.613 m)    General appearance: alert, cooperative and appears stated age Head: Normocephalic, without obvious abnormality, atraumatic Neck: no adenopathy, supple, symmetrical, trachea midline and thyroid normal to inspection and palpation Lungs: clear to auscultation bilaterally Breasts: normal appearance, no masses or tenderness, No nipple retraction or dimpling, No nipple discharge or bleeding, No axillary or supraclavicular adenopathy Heart: regular rate and rhythm Abdomen: soft, non-tender; no masses,  no organomegaly Extremities: extremities normal, atraumatic, no cyanosis or edema Skin: Skin color, texture, turgor normal. No rashes or lesions Lymph nodes: Cervical, supraclavicular, and axillary nodes normal. No abnormal inguinal nodes palpated Neurologic: Grossly normal   Pelvic: External genitalia:  no lesions              Urethra:  normal appearing urethra with no masses, tenderness or lesions  Bartholin's and Skene's: normal                 Vagina: normal appearing vagina with normal color and discharge, no lesions              Cervix: normal, no polyp noted(tiny one noted on last exam) non tender              Pap taken: no Bimanual Exam:  Uterus:  normal size, contour, position, consistency, mobility, non-tender and anteverted              Adnexa: normal adnexa and no mass, fullness, tenderness               Rectovaginal: Confirms               Anus:  normal sphincter tone, no lesions  A:  Well Woman with normal exam  Menopausal on HRT desires continuance  Recent lower back surgery, still under follow up  P:  Reviewed health and wellness pertinent to  exam  Discussed risks and benefits, and [facility] information on continued use.  Decided together to continue HRT. Patient will notify if any health changes.  Rx Prometrium see order  Rx Estrace see order  Pap smear as per guidelines   Mammogram yearly pap smear Not taken today  counseled on breast self exam, mammography screening, menopause, adequate intake of calcium and vitamin D, diet and exercise, Kegel's exercises  return annually or prn  An After Visit Summary was printed and given to the patient.  Reviewed, TL

## 2013-01-16 NOTE — Patient Instructions (Addendum)

## 2013-03-01 ENCOUNTER — Other Ambulatory Visit: Payer: Self-pay

## 2013-06-01 ENCOUNTER — Other Ambulatory Visit: Payer: Self-pay

## 2013-07-18 ENCOUNTER — Other Ambulatory Visit: Payer: Self-pay

## 2013-07-18 DIAGNOSIS — Z1231 Encounter for screening mammogram for malignant neoplasm of breast: Secondary | ICD-10-CM

## 2013-08-21 ENCOUNTER — Ambulatory Visit
Admission: RE | Admit: 2013-08-21 | Discharge: 2013-08-21 | Disposition: A | Discharge status: Home / Self Care | Payer: BC Managed Care – PPO | Source: Ambulatory Visit

## 2013-08-21 DIAGNOSIS — Z1231 Encounter for screening mammogram for malignant neoplasm of breast: Secondary | ICD-10-CM

## 2014-01-17 ENCOUNTER — Encounter: Payer: Self-pay | Admitting: Certified Nurse Midwife

## 2014-01-17 ENCOUNTER — Ambulatory Visit (INDEPENDENT_AMBULATORY_CARE_PROVIDER_SITE_OTHER): Payer: BC Managed Care – PPO | Admitting: Certified Nurse Midwife

## 2014-01-17 VITALS — BP 120/78 | HR 70 | Resp 16 | Ht 63.25 in | Wt 122.0 lb

## 2014-01-17 DIAGNOSIS — Z124 Encounter for screening for malignant neoplasm of cervix: Secondary | ICD-10-CM

## 2014-01-17 DIAGNOSIS — N390 Urinary tract infection, site not specified: Secondary | ICD-10-CM

## 2014-01-17 DIAGNOSIS — Z0000 Encounter for general adult medical examination without abnormal findings: Secondary | ICD-10-CM

## 2014-01-17 DIAGNOSIS — Z01419 Encounter for gynecological examination (general) (routine) without abnormal findings: Secondary | ICD-10-CM

## 2014-01-17 DIAGNOSIS — N951 Menopausal and female climacteric states: Secondary | ICD-10-CM

## 2014-01-17 LAB — POCT URINALYSIS DIPSTICK
Bilirubin, UA: NEGATIVE
Glucose, UA: NEGATIVE
Ketones, UA: NEGATIVE
NITRITE UA: POSITIVE
PH UA: 5
PROTEIN UA: NEGATIVE
RBC UA: NEGATIVE
Urobilinogen, UA: NEGATIVE

## 2014-01-17 MED ORDER — ESTRADIOL 0.5 MG PO TABS: 0.5000 mg | ORAL_TABLET | Freq: Every day | ORAL | Status: DC

## 2014-01-17 MED ORDER — PROGESTERONE MICRONIZED 100 MG PO CAPS: 100.0000 mg | ORAL_CAPSULE | Freq: Every day | ORAL | Status: DC

## 2014-01-17 NOTE — Patient Instructions (Signed)
EXERCISE AND DIET:  We recommended that you start or continue a regular exercise program for good health. Regular exercise means any activity that makes your heart beat faster and makes you sweat.  We recommend exercising at least 30 minutes per day at least 3 days a week, preferably 4 or 5.  We also recommend a diet low in fat and sugar.  Inactivity, poor dietary choices and obesity can cause diabetes, heart attack, stroke, and kidney damage, among others.    ALCOHOL AND SMOKING:  Women should limit their alcohol intake to no more than 7 drinks/beers/glasses of wine (combined, not each!) per week. Moderation of alcohol intake to this level decreases your risk of breast cancer and liver damage. And of course, no recreational drugs are part of a healthy lifestyle.  And absolutely no smoking or even second hand smoke. Most people know smoking can cause heart and lung diseases, but did you know it also contributes to weakening of your bones? Aging of your skin?  Yellowing of your teeth and nails?  CALCIUM AND VITAMIN D:  Adequate intake of calcium and Vitamin D are recommended.  The recommendations for exact amounts of these supplements seem to change often, but generally speaking 600 mg of calcium (either carbonate or citrate) and 800 units of Vitamin D per day seems prudent. Certain women may benefit from higher intake of Vitamin D.  If you are among these women, your doctor will have told you during your visit.    PAP SMEARS:  Pap smears, to check for cervical cancer or precancers,  have traditionally been done yearly, although recent scientific advances have shown that most women can have pap smears less often.  However, every woman still should have a physical exam from her gynecologist every year. It will include a breast check, inspection of the vulva and vagina to check for abnormal growths or skin changes, a visual exam of the cervix, and then an exam to evaluate the size and shape of the uterus and  ovaries.  And after 66 years of age, a rectal exam is indicated to check for rectal cancers. We will also provide age appropriate advice regarding health maintenance, like when you should have certain vaccines, screening for sexually transmitted diseases, bone density testing, colonoscopy, mammograms, etc.   MAMMOGRAMS:  All women over 40 years old should have a yearly mammogram. Many facilities now offer a "3D" mammogram, which may cost around $50 extra out of pocket. If possible,  we recommend you accept the option to have the 3D mammogram performed.  It both reduces the number of women who will be called back for extra views which then turn out to be normal, and it is better than the routine mammogram at detecting truly abnormal areas.    COLONOSCOPY:  Colonoscopy to screen for colon cancer is recommended for all women at age 50.  We know, you hate the idea of the prep.  We agree, BUT, having colon cancer and not knowing it is worse!!  Colon cancer so often starts as a polyp that can be seen and removed at colonscopy, which can quite literally save your life!  And if your first colonoscopy is normal and you have no family history of colon cancer, most women don't have to have it again for 10 years.  Once every ten years, you can do something that may end up saving your life, right?  We will be happy to help you get it scheduled when you are ready.    Be sure to check your insurance coverage so you understand how much it will cost.  It may be covered as a preventative service at no cost, but you should check your particular policy.     Asymptomatic Bacteriuria Asymptomatic bacteriuria is the presence of a large number of bacteria in your urine without the usual symptoms of burning or frequent urination. The following conditions increase the risk of asymptomatic bacteriuria:  Diabetes mellitus.  Advanced age.  Pregnancy in the first trimester.  Kidney stones.  Kidney transplants.  Leaky kidney  tube valve in young children (reflux). Treatment for this condition is not needed in most people and can lead to other problems such as too much yeast and growth of resistant bacteria. However, some people, such as pregnant women, do need treatment to prevent kidney infection. Asymptomatic bacteriuria in pregnancy is also associated with fetal growth restriction, premature labor, and newborn death. HOME CARE INSTRUCTIONS Monitor your condition for any changes. The following actions may help to relieve any discomfort you are feeling:  Drink enough water and fluids to keep your urine clear or pale yellow. Go to the bathroom more often to keep your bladder empty.  Keep the area around your vagina and rectum clean. Wipe yourself from front to back after urinating. SEEK IMMEDIATE MEDICAL CARE IF:  You develop signs of an infection such as:  Burning with urination.  Frequency of voiding.  Back pain.  Fever.  You have blood in the urine.  You develop a fever. MAKE SURE YOU:  Understand these instructions.  Will watch your condition.  Will get help right away if you are not doing well or get worse. Document Released: 07/13/2005 Document Revised: 07/18/2013 Document Reviewed: 01/02/2013 [facility] Patient Information 2015 [place], [place]. This information is not intended to replace advice given to you by your health care provider. Make sure you discuss any questions you have with your health care provider.

## 2014-01-17 NOTE — Progress Notes (Signed)
68 y.o. G26P2002 Married Caucasian Fe here for annual exam.  Menopausal on HRT, working well, and desires continuance. Denies vaginal bleeding or vaginal dryness. Denies urinary frequency, burning or urgency or incomplete emptying of bladder. Sees PCP yearly for aex, labs and is being followed with Hgb A1-C. Has had a stressful year, but has changed positions with work and feeling better. No health issues today.  Patient's last menstrual period was 07/27/2001.          Sexually active: yes  The current method of family planning is vasectomy.    Exercising: yes  Yoga,walking Smoker:  no  Health Maintenance: Pap: 01-13-12 neg HPV HR neg MMG:  08-21-13 normal Colonoscopy:  2011 BMD:   05-17-12 TDaP:  2011 Labs: Poct urine-wbc-tr, nitirite-positive Self breast exam: done monthly   reports that she has quit smoking. She does not have any smokeless tobacco history on file. She reports that she drinks about 4.2 ounces of alcohol per week. She reports that she does not use illicit drugs.  Past Medical History  Diagnosis Date  . No pertinent past medical history   . Anemia     Past Surgical History  Procedure Laterality Date  . Back surgery    . Cosmetic surgery      eyes & under chin    Current Outpatient Prescriptions  Medication Sig Dispense Refill  . Calcium Carbonate-Vitamin D (CALCIUM 600 + D PO) Take 2 tablets by mouth daily.      . Cholecalciferol (VITAMIN D) 2000 UNITS tablet Take 2,000 Units by mouth daily. Doesn't take often if she takes the calcium+d      . estradiol (ESTRACE) 0.5 MG tablet Take 1 tablet (0.5 mg total) by mouth daily.  30 tablet  12  . fluocinonide cream (LIDEX) 0.05 % as needed.       . progesterone (PROMETRIUM) 100 MG capsule Take 1 capsule (100 mg total) by mouth daily.  30 capsule  12  . valACYclovir (VALTREX) 1000 MG tablet as needed.       No current facility-administered medications for this visit.    Family History  Problem Relation Age of Onset   . Cancer Father     colon  . Hypertension Father   . Stroke Father     ROS:  Pertinent items are noted in HPI.  Otherwise, a comprehensive ROS was negative.  Exam:   BP 120/78  Pulse 70  Resp 16  Ht 5' 3.25" (1.607 m)  Wt 122 lb (55.339 kg)  BMI 21.43 kg/m2  LMP 07/27/2001 Height: 5' 3.25" (160.7 cm)  Ht Readings from Last 3 Encounters:  01/17/14 5' 3.25" (1.607 m)  01/16/13 5' 3.25" (1.607 m)  04/30/12 5' 3.5" (1.613 m)    General appearance: alert, cooperative and appears stated age Head: Normocephalic, without obvious abnormality, atraumatic Neck: no adenopathy, supple, symmetrical, trachea midline and thyroid normal to inspection and palpation and non-palpable Lungs: clear to auscultation bilaterally CVAT negative bilateral Breasts: normal appearance, no masses or tenderness, No nipple retraction or dimpling, No nipple discharge or bleeding, No axillary or supraclavicular adenopathy Heart: regular rate and rhythm Abdomen: soft, non-tender; no masses,  no organomegaly, negative suprapubic Extremities: extremities normal, atraumatic, no cyanosis or edema Skin: Skin color, texture, turgor normal. No rashes or lesions, warm and dry Lymph nodes: Cervical, supraclavicular, and axillary nodes normal. No abnormal inguinal nodes palpated Neurologic: Grossly normal   Pelvic: External genitalia:  no lesions  Urethra:  normal appearing urethra with no masses, tenderness or lesions  Bladder, urethral meatus non tender              Bartholin's and Skene's: normal                 Vagina: normal appearing vagina with normal color and discharge, no lesions              Cervix: normal, non tender              Pap taken: yes Bimanual Exam:  Uterus:  normal size, contour, position, consistency, mobility, non-tender and anteverted              Adnexa: normal adnexa and no mass, fullness, tenderness               Rectovaginal: Confirms               Anus:  normal sphincter  tone, no lesions  A:  Well Woman with normal exam  Menopausal on HRT, desires continuance  Asymptomatic UTI  P:   Reviewed health and wellness pertinent to exam  Discussed risks/benefits of OCP, [facility] information of use in women over 60. Patient has tried to decrease and feels very uncomfortable and feels"myself' on HRT. Provider and patient agree this is a good choice to continue. Patient to advise if any history change.  Rx Estrace see order  Rx Prometrium see order  NTZ:GYFVC culture/micro  Discussed findings and patient does not want medication unless needed. She "feels fine". Discussed this can change when bacteria present. Patient will take medication once labs in. Will call if becomes symptomatic.  Pap smear taken today with HPV reflex  counseled on breast self exam, mammography screening, use and side effects of HRT, adequate intake of calcium and vitamin D, diet and exercise  return annually or prn  An After Visit Summary was printed and given to the patient.

## 2014-01-18 LAB — URINALYSIS, MICROSCOPIC ONLY
BACTERIA UA: NONE SEEN
CASTS: NONE SEEN
CRYSTALS: NONE SEEN
Squamous Epithelial / LPF: NONE SEEN

## 2014-01-18 MED ORDER — NITROFURANTOIN MONOHYD MACRO 100 MG PO CAPS: 100.0000 mg | ORAL_CAPSULE | Freq: Two times a day (BID) | ORAL | Status: DC

## 2014-01-19 ENCOUNTER — Telehealth: Payer: Self-pay | Admitting: Emergency Medicine

## 2014-01-19 LAB — URINE CULTURE: Colony Count: >=100000

## 2014-01-19 LAB — IPS PAP TEST WITH REFLEX TO HPV

## 2014-01-19 NOTE — Telephone Encounter (Signed)
Spoke with patient and message from [facility]. She verbalized understanding of results and agreeable with plan going forward. She will be out of town until 7/14 and requests test of cure appointment for after this date. Scheduled test of cure for 7/16 (patient requests this date) and will call with any concerns.   Routing to provider for final review. Patient agreeable to disposition. Will close encounter

## 2014-01-19 NOTE — Telephone Encounter (Signed)
Message copied by Michele Mcalpine on Fri Jan 19, 2014  8:43 AM ------      Message from: Regina Eck      Created: Thu Jan 18, 2014 11:29 PM       Notify patient that urine culture showed E.coli greater than 100,000 colonies, needs Rx for      Rx Macrobid order in      Patient needs 2 week TOC appointment, for culture order in, please schedule ------

## 2014-01-22 NOTE — Progress Notes (Signed)
Reviewed personally.  M. Suzanne Miller, MD.  

## 2014-01-25 ENCOUNTER — Telehealth: Payer: Self-pay | Admitting: Certified Nurse Midwife

## 2014-01-25 ENCOUNTER — Other Ambulatory Visit: Payer: Self-pay | Admitting: Certified Nurse Midwife

## 2014-01-25 DIAGNOSIS — N390 Urinary tract infection, site not specified: Secondary | ICD-10-CM

## 2014-01-25 MED ORDER — CIPROFLOXACIN HCL 500 MG PO TABS: 500.0000 mg | ORAL_TABLET | Freq: Two times a day (BID) | ORAL | Status: DC

## 2014-01-25 NOTE — Telephone Encounter (Signed)
Patient has history of resistant bacteria , Macrobid may not be covering it. Have patient stop Macrobid. Have patient start Cipro bid for 5 days should resolve. Keep appointment when completed. Rx sent

## 2014-01-25 NOTE — Telephone Encounter (Signed)
Spoke with patient. Patient states that she started macrobid on Sunday and has been having increased pressure and urinary frequency. "I am almost done with my antibiotics and I am still having the symptoms. Usually it knocks it right out. I would usually just let it go but I am going to be out of town for two weeks and I am worried that I will get stuck." Advised would send a message over to Regina Eck CNM for review and advise and give patient a call back with further instructions and recommendations. Patient agreeable.

## 2014-01-25 NOTE — Telephone Encounter (Signed)
Patient is calling asking for more pills for a uti. Said she just doesn't feel like a week is going to be enough and she is going out of town for 2 weeks for work. She is leaving Saturday morning.

## 2014-01-25 NOTE — Telephone Encounter (Signed)
Spoke with patient. Advised of message as seen below from [place]. Patient agreeable and verbalizes understanding.  Routing to provider for final review. Patient agreeable to disposition. Will close encounter

## 2014-02-08 ENCOUNTER — Ambulatory Visit: Payer: Medicare Other | Admitting: Certified Nurse Midwife

## 2014-02-08 VITALS — BP 114/68 | HR 60 | Ht 63.25 in | Wt 124.0 lb

## 2014-02-08 DIAGNOSIS — N390 Urinary tract infection, site not specified: Secondary | ICD-10-CM

## 2014-02-08 NOTE — Progress Notes (Signed)
Pt came in for a nurse visit to give a urine TOC Pt states no sx and has one more day of antibiotics.  Culture sent to lab

## 2014-02-09 LAB — URINE CULTURE
COLONY COUNT: NO GROWTH
Organism ID, Bacteria: NO GROWTH

## 2014-05-01 DIAGNOSIS — H01002 Unspecified blepharitis right lower eyelid: Secondary | ICD-10-CM | POA: Diagnosis not present

## 2014-05-01 DIAGNOSIS — H25013 Cortical age-related cataract, bilateral: Secondary | ICD-10-CM | POA: Diagnosis not present

## 2014-05-01 DIAGNOSIS — H43813 Vitreous degeneration, bilateral: Secondary | ICD-10-CM | POA: Diagnosis not present

## 2014-05-01 DIAGNOSIS — H3562 Retinal hemorrhage, left eye: Secondary | ICD-10-CM | POA: Diagnosis not present

## 2014-05-01 DIAGNOSIS — H01001 Unspecified blepharitis right upper eyelid: Secondary | ICD-10-CM | POA: Diagnosis not present

## 2014-05-01 DIAGNOSIS — H3531 Nonexudative age-related macular degeneration: Secondary | ICD-10-CM | POA: Diagnosis not present

## 2014-05-04 DIAGNOSIS — Z419 Encounter for procedure for purposes other than remedying health state, unspecified: Secondary | ICD-10-CM | POA: Diagnosis not present

## 2014-05-04 DIAGNOSIS — L738 Other specified follicular disorders: Secondary | ICD-10-CM | POA: Diagnosis not present

## 2014-05-04 DIAGNOSIS — D2262 Melanocytic nevi of left upper limb, including shoulder: Secondary | ICD-10-CM | POA: Diagnosis not present

## 2014-05-04 DIAGNOSIS — L821 Other seborrheic keratosis: Secondary | ICD-10-CM | POA: Diagnosis not present

## 2014-05-04 DIAGNOSIS — D2271 Melanocytic nevi of right lower limb, including hip: Secondary | ICD-10-CM | POA: Diagnosis not present

## 2014-05-10 DIAGNOSIS — E784 Other hyperlipidemia: Secondary | ICD-10-CM | POA: Diagnosis not present

## 2014-05-10 DIAGNOSIS — R7301 Impaired fasting glucose: Secondary | ICD-10-CM | POA: Diagnosis not present

## 2014-05-10 DIAGNOSIS — M810 Age-related osteoporosis without current pathological fracture: Secondary | ICD-10-CM | POA: Diagnosis not present

## 2014-05-18 ENCOUNTER — Other Ambulatory Visit: Payer: Self-pay | Admitting: Internal Medicine

## 2014-05-18 ENCOUNTER — Telehealth: Payer: Self-pay | Admitting: Certified Nurse Midwife

## 2014-05-18 DIAGNOSIS — Z1231 Encounter for screening mammogram for malignant neoplasm of breast: Secondary | ICD-10-CM

## 2014-05-18 DIAGNOSIS — M8580 Other specified disorders of bone density and structure, unspecified site: Secondary | ICD-10-CM

## 2014-05-18 DIAGNOSIS — L309 Dermatitis, unspecified: Secondary | ICD-10-CM | POA: Diagnosis not present

## 2014-05-18 DIAGNOSIS — E784 Other hyperlipidemia: Secondary | ICD-10-CM | POA: Diagnosis not present

## 2014-05-18 DIAGNOSIS — R7301 Impaired fasting glucose: Secondary | ICD-10-CM | POA: Diagnosis not present

## 2014-05-18 DIAGNOSIS — Z0000 Encounter for general adult medical examination without abnormal findings: Secondary | ICD-10-CM | POA: Diagnosis not present

## 2014-05-18 DIAGNOSIS — H3560 Retinal hemorrhage, unspecified eye: Secondary | ICD-10-CM | POA: Diagnosis not present

## 2014-05-18 DIAGNOSIS — Z23 Encounter for immunization: Secondary | ICD-10-CM | POA: Diagnosis not present

## 2014-05-18 DIAGNOSIS — E7849 Other hyperlipidemia: Secondary | ICD-10-CM

## 2014-05-18 DIAGNOSIS — Z800 Family history of malignant neoplasm of digestive organs: Secondary | ICD-10-CM | POA: Diagnosis not present

## 2014-05-18 NOTE — Telephone Encounter (Signed)
Yolanda Nicholson is calling to get more information for a prior authorization.

## 2014-05-18 NOTE — Telephone Encounter (Signed)
Spoke with [employer] representative regarding Estradiol 0.5 mg oral tablet prior authorization.   They requested to know if patient had tried and failed formulary alternatives.   I advised formulary alternatives were not appropriate for patient symptoms.  Formulary alternatives as follows:  Estradiol Valerate and Depo Estradiol Premarin Cream, Femring and Estring Alendronate, Raloxifene, Prolia   They advised they would have MD review and return response via fax.

## 2014-05-21 DIAGNOSIS — Z1212 Encounter for screening for malignant neoplasm of rectum: Secondary | ICD-10-CM | POA: Diagnosis not present

## 2014-05-21 LAB — IFOBT (OCCULT BLOOD): IFOBT: NEGATIVE

## 2014-05-23 ENCOUNTER — Other Ambulatory Visit: Payer: Self-pay | Admitting: Internal Medicine

## 2014-05-23 DIAGNOSIS — E7849 Other hyperlipidemia: Secondary | ICD-10-CM

## 2014-05-24 ENCOUNTER — Ambulatory Visit
Admission: RE | Admit: 2014-05-24 | Discharge: 2014-05-24 | Disposition: A | Discharge status: Home / Self Care | Payer: No Typology Code available for payment source | Source: Ambulatory Visit | Attending: Internal Medicine | Admitting: Internal Medicine

## 2014-05-24 DIAGNOSIS — E7849 Other hyperlipidemia: Secondary | ICD-10-CM

## 2014-05-24 MED ORDER — IOHEXOL 300 MG/ML  SOLN: 125.0000 mL | Freq: Once | INTRAMUSCULAR | Status: DC | PRN

## 2014-05-28 ENCOUNTER — Encounter: Payer: Self-pay | Admitting: Certified Nurse Midwife

## 2014-05-28 ENCOUNTER — Telehealth: Payer: Self-pay | Admitting: Certified Nurse Midwife

## 2014-05-28 DIAGNOSIS — R070 Pain in throat: Secondary | ICD-10-CM | POA: Diagnosis not present

## 2014-05-28 DIAGNOSIS — R590 Localized enlarged lymph nodes: Secondary | ICD-10-CM | POA: Diagnosis not present

## 2014-05-28 NOTE — Telephone Encounter (Signed)
Pt has some health issues she would like to discuss with DL

## 2014-05-28 NOTE — Telephone Encounter (Signed)
If patient has had change in health status, she now needs to consider weaning off her HRT. She may want to schedule OV to discuss and bring records from PCP. Did her PCP recommend any change?

## 2014-05-28 NOTE — Telephone Encounter (Signed)
Spoke with patient. Patient states that she has gotten new insurance since she was last seen at our office. When patient got Estradiol filled last they gave her a 30 day supply instead of a 90 day supply. "They told me it was due to being over 65 and being on this medication. They said there are others they would give me but not this one." Patient was seen with PCP for physical and was diagnosed with high cholesterol. Had "heasrt calcium CT" done and "I was in the 90 percentile range. So Dr.Shaw wanted to start me on Lipitor. I have been on it for four days. I wanted to know what Debbi thinks about this with my hormones. We have talked about this before and getting off with my age but haven't since everything was working well. I just want to know what she thinks now since all of this." Advised patient would send a message over to Regina Eck CNM and return call with further recommendations and instructions. Patient is agreeable and aware Regina Eck CNM is out of the office today. Will return call asap with her return tomorrow.

## 2014-05-29 NOTE — Telephone Encounter (Signed)
Called patient and message from Regina Eck CNM discussed.  She is agreeable to scheduling office visit to discuss, will request records from pcp.  Appointment made for 06/05/14. Patient agreeable. Routing to provider for final review. Patient agreeable to disposition. Will close encounter

## 2014-06-05 ENCOUNTER — Ambulatory Visit (INDEPENDENT_AMBULATORY_CARE_PROVIDER_SITE_OTHER): Payer: Medicare Other | Admitting: Certified Nurse Midwife

## 2014-06-05 ENCOUNTER — Encounter: Payer: Self-pay | Admitting: Certified Nurse Midwife

## 2014-06-05 VITALS — BP 110/60 | HR 64 | Resp 16 | Ht 63.25 in | Wt 119.0 lb

## 2014-06-05 DIAGNOSIS — Z79890 Hormone replacement therapy (postmenopausal): Secondary | ICD-10-CM

## 2014-06-05 NOTE — Progress Notes (Signed)
68 y.o.Married Caucasian female G2P2002 here for discussion of continued HRT use. Patient was instructed at last visit that if any health changes she should advise. Patient has had PCP visit with Labs in the past month with elevated cholesterol and other lipid findings( records here). She also had a recent cardiac avail which showed mild cardiomyopathy and atherosclerosis. She currently leads a healthy lifestyle with regular exercise and good diet with normal weight patterns. She does have some family history of hypertension and stroke. She has 4  pills of HRT left and wonders if she should stop. No other concerns today.  O:Healthy WD,WN female, appropriately dressed    Weight: Affect : normal  A:Menopausal on HRT with new diagnosis of elevated cholesterol and heart evaluation with mild changes noted   P:Discussed risks and benefits of HRT and contraindication for use. Cardiac changes is one of these. Discussed recommendation to stop with this RX completion to decrease her risk with use. Patient agreeable to this plan. Expectations with hot flashes and night sweats discussed. Discussed that the cessation and symptoms should resolve fairly quickly if she has them. Questions answered at length. Patient will advise if problems for recommendations.  Rv prn  30 minutes spent with patient  in face to face counseling regarding HRT use.

## 2014-06-06 ENCOUNTER — Encounter: Payer: Self-pay | Admitting: Certified Nurse Midwife

## 2014-06-06 DIAGNOSIS — E7800 Pure hypercholesterolemia, unspecified: Secondary | ICD-10-CM | POA: Insufficient documentation

## 2014-06-06 NOTE — Progress Notes (Signed)
Absolutely agree with cessation of HRT.  Reviewed personally.  Felipa Emory, MD.

## 2014-06-06 NOTE — Patient Instructions (Signed)

## 2014-06-14 DIAGNOSIS — H3531 Nonexudative age-related macular degeneration: Secondary | ICD-10-CM | POA: Diagnosis not present

## 2014-06-14 DIAGNOSIS — H3562 Retinal hemorrhage, left eye: Secondary | ICD-10-CM | POA: Diagnosis not present

## 2014-07-31 DIAGNOSIS — H3531 Nonexudative age-related macular degeneration: Secondary | ICD-10-CM | POA: Diagnosis not present

## 2014-07-31 DIAGNOSIS — H3562 Retinal hemorrhage, left eye: Secondary | ICD-10-CM | POA: Diagnosis not present

## 2014-08-28 DIAGNOSIS — E784 Other hyperlipidemia: Secondary | ICD-10-CM | POA: Diagnosis not present

## 2014-08-29 ENCOUNTER — Ambulatory Visit
Admission: RE | Admit: 2014-08-29 | Discharge: 2014-08-29 | Disposition: A | Discharge status: Home / Self Care | Payer: Medicare Other | Source: Ambulatory Visit | Attending: Internal Medicine | Admitting: Internal Medicine

## 2014-08-29 DIAGNOSIS — M8580 Other specified disorders of bone density and structure, unspecified site: Secondary | ICD-10-CM

## 2014-08-29 DIAGNOSIS — Z780 Asymptomatic menopausal state: Secondary | ICD-10-CM | POA: Diagnosis not present

## 2014-08-29 DIAGNOSIS — M85852 Other specified disorders of bone density and structure, left thigh: Secondary | ICD-10-CM | POA: Diagnosis not present

## 2014-08-29 DIAGNOSIS — Z1231 Encounter for screening mammogram for malignant neoplasm of breast: Secondary | ICD-10-CM | POA: Diagnosis not present

## 2014-08-29 DIAGNOSIS — M8588 Other specified disorders of bone density and structure, other site: Secondary | ICD-10-CM | POA: Diagnosis not present

## 2014-11-22 DIAGNOSIS — H3531 Nonexudative age-related macular degeneration: Secondary | ICD-10-CM | POA: Diagnosis not present

## 2014-11-22 DIAGNOSIS — H3562 Retinal hemorrhage, left eye: Secondary | ICD-10-CM | POA: Diagnosis not present

## 2015-01-21 ENCOUNTER — Other Ambulatory Visit: Payer: Self-pay

## 2015-01-23 ENCOUNTER — Ambulatory Visit: Payer: BC Managed Care – PPO | Admitting: Certified Nurse Midwife

## 2015-02-25 DIAGNOSIS — D485 Neoplasm of uncertain behavior of skin: Secondary | ICD-10-CM | POA: Diagnosis not present

## 2015-02-25 DIAGNOSIS — L820 Inflamed seborrheic keratosis: Secondary | ICD-10-CM | POA: Diagnosis not present

## 2015-02-25 DIAGNOSIS — L738 Other specified follicular disorders: Secondary | ICD-10-CM | POA: Diagnosis not present

## 2015-02-25 DIAGNOSIS — D224 Melanocytic nevi of scalp and neck: Secondary | ICD-10-CM | POA: Diagnosis not present

## 2015-03-14 ENCOUNTER — Ambulatory Visit (INDEPENDENT_AMBULATORY_CARE_PROVIDER_SITE_OTHER): Payer: Medicare Other | Admitting: Certified Nurse Midwife

## 2015-03-14 ENCOUNTER — Encounter: Payer: Self-pay | Admitting: Certified Nurse Midwife

## 2015-03-14 VITALS — BP 110/64 | HR 80 | Resp 16 | Ht 63.25 in | Wt 122.0 lb

## 2015-03-14 DIAGNOSIS — Z124 Encounter for screening for malignant neoplasm of cervix: Secondary | ICD-10-CM | POA: Diagnosis not present

## 2015-03-14 DIAGNOSIS — H3531 Nonexudative age-related macular degeneration: Secondary | ICD-10-CM | POA: Diagnosis not present

## 2015-03-14 DIAGNOSIS — Z01419 Encounter for gynecological examination (general) (routine) without abnormal findings: Secondary | ICD-10-CM

## 2015-03-14 DIAGNOSIS — H4312 Vitreous hemorrhage, left eye: Secondary | ICD-10-CM | POA: Diagnosis not present

## 2015-03-14 DIAGNOSIS — H3562 Retinal hemorrhage, left eye: Secondary | ICD-10-CM | POA: Diagnosis not present

## 2015-03-14 NOTE — Progress Notes (Signed)
69 y.o. G2P2002 Married  Caucasian Fe here for annual exam.  Menopausal stopped HRT 11/15. Denies vaginal bleeding or vaginal dryness.  Due to cardiac screen with concerns from PCP with lab elevation of cholesterol and mild cardiomyopathy. Taking Lipitor with no problems until recent muscular changes.No HSV outbreaks in past year.. Aware of osteopenia with last BMD and is working on exercise and calcium in diet. Will have Vit. D check with PCP. No other health issues today.   Patient's last menstrual period was 07/27/2001.          Sexually active: Yes.    The current method of family planning is vasectomy.    Exercising: Yes.    yoga, walk, cardio Smoker:  no  Health Maintenance: Pap: 01-17-14 neg MMG:  08-29-14 category c density,birads 1:neg Colonoscopy:  2011 BMD:   2016 TDaP:  2011 Labs: pcp Self breast exam: done monthly   reports that she has quit smoking. She has never used smokeless tobacco. She reports that she drinks alcohol. She reports that she does not use illicit drugs.  Past Medical History  Diagnosis Date  . No pertinent past medical history   . Anemia   . Elevated cholesterol     Past Surgical History  Procedure Laterality Date  . Back surgery    . Cosmetic surgery      eyes & under chin    Current Outpatient Prescriptions  Medication Sig Dispense Refill  . ASPIRIN PO Take by mouth as needed.    Marland Kitchen atorvastatin (LIPITOR) 20 MG tablet     . Calcium Carbonate-Vitamin D (CALCIUM 600 + D PO) Take 2 tablets by mouth daily.    . Cholecalciferol (VITAMIN D) 2000 UNITS tablet Take 2,000 Units by mouth daily. Doesn't take often if she takes the calcium+d    . fluocinonide cream (LIDEX) 0.05 % as needed.     . valACYclovir (VALTREX) 1000 MG tablet as needed.     No current facility-administered medications for this visit.    Family History  Problem Relation Age of Onset  . Cancer Father     colon  . Hypertension Father   . Stroke Father     ROS:  Pertinent  items are noted in HPI.  Otherwise, a comprehensive ROS was negative.  Exam:   BP 110/64 mmHg  Pulse 80  Resp 16  Ht 5' 3.25" (1.607 m)  Wt 122 lb (55.339 kg)  BMI 21.43 kg/m2  LMP 07/27/2001 Height: 5' 3.25" (160.7 cm) Ht Readings from Last 3 Encounters:  03/14/15 5' 3.25" (1.607 m)  06/05/14 5' 3.25" (1.607 m)  02/08/14 5' 3.25" (1.607 m)    General appearance: alert, cooperative and appears stated age Head: Normocephalic, without obvious abnormality, atraumatic Neck: no adenopathy, supple, symmetrical, trachea midline and thyroid normal to inspection and palpation Lungs: clear to auscultation bilaterally Breasts: normal appearance, no masses or tenderness, No nipple retraction or dimpling, No nipple discharge or bleeding, No axillary or supraclavicular adenopathy Heart: regular rate and rhythm Abdomen: soft, non-tender; no masses,  no organomegaly Extremities: extremities normal, atraumatic, no cyanosis or edema Skin: Skin color, texture, turgor normal. No rashes or lesions Lymph nodes: Cervical, supraclavicular, and axillary nodes normal. No abnormal inguinal nodes palpated Neurologic: Grossly normal   Pelvic: External genitalia:  no lesions              Urethra:  normal appearing urethra with no masses, tenderness or lesions  Bartholin's and Skene's: normal                 Vagina: normal appearing vagina with normal color and discharge, no lesions              Cervix: normal              Pap taken: No. Bimanual Exam:  Uterus:  normal size, contour, position, consistency, mobility, non-tender              Adnexa: normal adnexa and no mass, fullness, tenderness               Rectovaginal: Confirms               Anus:  normal sphincter tone, no lesions  Chaperone present: Yes  A:  Well Woman with normal exam  Menopausal no HRT  Cholesterol and mild cardiomyopathy management with PCP and cardiology  Osteopenia with PCP management  P:   Reviewed health and  wellness pertinent to exam  Aware of need to advise if vaginal bleeding.  Continue follow up with MD as indicated  Pap smear as above not taken   counseled on breast self exam, mammography screening, osteoporosis, adequate intake of calcium and vitamin D, diet and exercise  return annually or prn  An After Visit Summary was printed and given to the patient.

## 2015-03-14 NOTE — Progress Notes (Signed)
Reviewed personally.  M. Suzanne Lynzie Cliburn, MD.  

## 2015-03-14 NOTE — Patient Instructions (Addendum)

## 2015-04-12 ENCOUNTER — Telehealth: Payer: Self-pay | Admitting: Certified Nurse Midwife

## 2015-04-12 NOTE — Telephone Encounter (Signed)
[facility] calling requesting more information on estridol prescription.

## 2015-04-12 NOTE — Telephone Encounter (Signed)
Will you try and call about this Monday morning?

## 2015-04-15 NOTE — Telephone Encounter (Signed)
Incoming call from [facility] at [facility]. Return call [phone].  She wanted to know if patient was currently on Estradiol. Advised that this medication is not her current active medication reconciliation. No further questions from representative.

## 2015-04-25 ENCOUNTER — Ambulatory Visit: Payer: Medicare Other | Admitting: Certified Nurse Midwife

## 2015-05-06 DIAGNOSIS — H524 Presbyopia: Secondary | ICD-10-CM | POA: Diagnosis not present

## 2015-05-06 DIAGNOSIS — H25013 Cortical age-related cataract, bilateral: Secondary | ICD-10-CM | POA: Diagnosis not present

## 2015-05-06 DIAGNOSIS — H2513 Age-related nuclear cataract, bilateral: Secondary | ICD-10-CM | POA: Diagnosis not present

## 2015-05-06 DIAGNOSIS — H04123 Dry eye syndrome of bilateral lacrimal glands: Secondary | ICD-10-CM | POA: Diagnosis not present

## 2015-05-20 DIAGNOSIS — R590 Localized enlarged lymph nodes: Secondary | ICD-10-CM | POA: Diagnosis not present

## 2015-05-20 DIAGNOSIS — R7301 Impaired fasting glucose: Secondary | ICD-10-CM | POA: Diagnosis not present

## 2015-05-20 DIAGNOSIS — M8580 Other specified disorders of bone density and structure, unspecified site: Secondary | ICD-10-CM | POA: Diagnosis not present

## 2015-05-20 DIAGNOSIS — E784 Other hyperlipidemia: Secondary | ICD-10-CM | POA: Diagnosis not present

## 2015-05-20 DIAGNOSIS — M859 Disorder of bone density and structure, unspecified: Secondary | ICD-10-CM | POA: Diagnosis not present

## 2015-05-21 DIAGNOSIS — Z1212 Encounter for screening for malignant neoplasm of rectum: Secondary | ICD-10-CM | POA: Diagnosis not present

## 2015-05-27 DIAGNOSIS — M2550 Pain in unspecified joint: Secondary | ICD-10-CM | POA: Diagnosis not present

## 2015-05-27 DIAGNOSIS — Z6821 Body mass index (BMI) 21.0-21.9, adult: Secondary | ICD-10-CM | POA: Diagnosis not present

## 2015-05-27 DIAGNOSIS — Z23 Encounter for immunization: Secondary | ICD-10-CM | POA: Diagnosis not present

## 2015-05-27 DIAGNOSIS — Z800 Family history of malignant neoplasm of digestive organs: Secondary | ICD-10-CM | POA: Diagnosis not present

## 2015-05-27 DIAGNOSIS — E784 Other hyperlipidemia: Secondary | ICD-10-CM | POA: Diagnosis not present

## 2015-05-27 DIAGNOSIS — I2510 Atherosclerotic heart disease of native coronary artery without angina pectoris: Secondary | ICD-10-CM | POA: Diagnosis not present

## 2015-05-27 DIAGNOSIS — M8580 Other specified disorders of bone density and structure, unspecified site: Secondary | ICD-10-CM | POA: Diagnosis not present

## 2015-05-27 DIAGNOSIS — R7301 Impaired fasting glucose: Secondary | ICD-10-CM | POA: Diagnosis not present

## 2015-05-27 DIAGNOSIS — Z0000 Encounter for general adult medical examination without abnormal findings: Secondary | ICD-10-CM | POA: Diagnosis not present

## 2015-05-27 DIAGNOSIS — Z1389 Encounter for screening for other disorder: Secondary | ICD-10-CM | POA: Diagnosis not present

## 2015-05-29 ENCOUNTER — Telehealth: Payer: Self-pay | Admitting: Internal Medicine

## 2015-05-29 NOTE — Telephone Encounter (Signed)
Received GI records from [facility] and placed on Dr. Celesta Aver desk for review. No specific doctor requested.

## 2015-06-18 DIAGNOSIS — D225 Melanocytic nevi of trunk: Secondary | ICD-10-CM | POA: Diagnosis not present

## 2015-06-18 DIAGNOSIS — L905 Scar conditions and fibrosis of skin: Secondary | ICD-10-CM | POA: Diagnosis not present

## 2015-06-18 DIAGNOSIS — I788 Other diseases of capillaries: Secondary | ICD-10-CM | POA: Diagnosis not present

## 2015-06-18 DIAGNOSIS — L649 Androgenic alopecia, unspecified: Secondary | ICD-10-CM | POA: Diagnosis not present

## 2015-06-18 DIAGNOSIS — D2239 Melanocytic nevi of other parts of face: Secondary | ICD-10-CM | POA: Diagnosis not present

## 2015-06-18 DIAGNOSIS — Z419 Encounter for procedure for purposes other than remedying health state, unspecified: Secondary | ICD-10-CM | POA: Diagnosis not present

## 2015-06-18 DIAGNOSIS — L738 Other specified follicular disorders: Secondary | ICD-10-CM | POA: Diagnosis not present

## 2015-06-18 DIAGNOSIS — L821 Other seborrheic keratosis: Secondary | ICD-10-CM | POA: Diagnosis not present

## 2015-07-02 ENCOUNTER — Encounter: Payer: Self-pay | Admitting: Internal Medicine

## 2015-07-02 NOTE — Telephone Encounter (Signed)
Dr. Carlean Purl reviewed records and has accepted. Recall colon not due until 2021. Informed patient of this.

## 2015-07-17 DIAGNOSIS — Z23 Encounter for immunization: Secondary | ICD-10-CM | POA: Diagnosis not present

## 2015-07-23 ENCOUNTER — Other Ambulatory Visit: Payer: Self-pay

## 2015-07-23 DIAGNOSIS — Z1231 Encounter for screening mammogram for malignant neoplasm of breast: Secondary | ICD-10-CM

## 2015-09-02 ENCOUNTER — Ambulatory Visit
Admission: RE | Admit: 2015-09-02 | Discharge: 2015-09-02 | Disposition: A | Discharge status: Home / Self Care | Payer: Medicare Other | Source: Ambulatory Visit

## 2015-09-02 DIAGNOSIS — Z1231 Encounter for screening mammogram for malignant neoplasm of breast: Secondary | ICD-10-CM

## 2015-09-05 ENCOUNTER — Ambulatory Visit: Payer: Medicare Other | Admitting: Internal Medicine

## 2015-09-10 DIAGNOSIS — M4722 Other spondylosis with radiculopathy, cervical region: Secondary | ICD-10-CM | POA: Diagnosis not present

## 2015-09-10 DIAGNOSIS — G5602 Carpal tunnel syndrome, left upper limb: Secondary | ICD-10-CM | POA: Diagnosis not present

## 2015-09-10 DIAGNOSIS — M542 Cervicalgia: Secondary | ICD-10-CM | POA: Diagnosis not present

## 2015-09-10 DIAGNOSIS — M5030 Other cervical disc degeneration, unspecified cervical region: Secondary | ICD-10-CM | POA: Diagnosis not present

## 2015-09-12 DIAGNOSIS — H43812 Vitreous degeneration, left eye: Secondary | ICD-10-CM | POA: Diagnosis not present

## 2015-09-12 DIAGNOSIS — H353112 Nonexudative age-related macular degeneration, right eye, intermediate dry stage: Secondary | ICD-10-CM | POA: Diagnosis not present

## 2015-09-12 DIAGNOSIS — H353122 Nonexudative age-related macular degeneration, left eye, intermediate dry stage: Secondary | ICD-10-CM | POA: Diagnosis not present

## 2015-10-02 DIAGNOSIS — Z6821 Body mass index (BMI) 21.0-21.9, adult: Secondary | ICD-10-CM | POA: Diagnosis not present

## 2015-10-02 DIAGNOSIS — R0789 Other chest pain: Secondary | ICD-10-CM | POA: Diagnosis not present

## 2015-10-02 DIAGNOSIS — I2510 Atherosclerotic heart disease of native coronary artery without angina pectoris: Secondary | ICD-10-CM | POA: Diagnosis not present

## 2015-10-02 DIAGNOSIS — M79602 Pain in left arm: Secondary | ICD-10-CM | POA: Diagnosis not present

## 2015-10-02 DIAGNOSIS — K219 Gastro-esophageal reflux disease without esophagitis: Secondary | ICD-10-CM | POA: Diagnosis not present

## 2015-10-07 ENCOUNTER — Telehealth (HOSPITAL_COMMUNITY): Payer: Self-pay | Admitting: *Deleted

## 2015-10-07 NOTE — Telephone Encounter (Signed)
Left message on voicemail per DPR in reference to upcoming appointment scheduled on 10/08/15 at 7:30 with detailed instructions given per Myocardial Perfusion Study Information Sheet for the test. LM to arrive 15 minutes early, and that it is imperative to arrive on time for appointment to keep from having the test rescheduled. If you need to cancel or reschedule your appointment, please call the office within 24 hours of your appointment. Failure to do so may result in a cancellation of your appointment, and a $50 no show fee. Phone number given for call back for any questions.

## 2015-10-08 ENCOUNTER — Other Ambulatory Visit: Payer: Self-pay | Admitting: Internal Medicine

## 2015-10-08 ENCOUNTER — Other Ambulatory Visit (HOSPITAL_COMMUNITY): Payer: Self-pay | Admitting: Internal Medicine

## 2015-10-08 ENCOUNTER — Ambulatory Visit (HOSPITAL_COMMUNITY): Payer: Medicare Other | Attending: Internal Medicine

## 2015-10-08 DIAGNOSIS — R002 Palpitations: Secondary | ICD-10-CM | POA: Diagnosis not present

## 2015-10-08 DIAGNOSIS — R079 Chest pain, unspecified: Secondary | ICD-10-CM | POA: Diagnosis not present

## 2015-10-08 LAB — MYOCARDIAL PERFUSION IMAGING
CHL CUP MPHR: 153 {beats}/min
CHL CUP RESTING HR STRESS: 61 {beats}/min
CSEPEDS: 0 s
CSEPEW: 10.1 METS
Exercise duration (min): 8 min
LVDIAVOL: 87 mL (ref 46–106)
LVSYSVOL: 26 mL
NUC STRESS TID: 0.87
Peak HR: 150 {beats}/min
Percent HR: 98 %
RATE: 0.29
SDS: 1
SRS: 1
SSS: 2

## 2015-10-08 MED ORDER — TECHNETIUM TC 99M SESTAMIBI GENERIC - CARDIOLITE
32.4000 | Freq: Once | INTRAVENOUS | Status: AC | PRN
  Administered 2015-10-08: 32 via INTRAVENOUS

## 2015-10-08 MED ORDER — TECHNETIUM TC 99M SESTAMIBI GENERIC - CARDIOLITE
10.7000 | Freq: Once | INTRAVENOUS | Status: AC | PRN
  Administered 2015-10-08: 11 via INTRAVENOUS

## 2015-10-25 DIAGNOSIS — M542 Cervicalgia: Secondary | ICD-10-CM | POA: Diagnosis not present

## 2015-10-25 DIAGNOSIS — G5602 Carpal tunnel syndrome, left upper limb: Secondary | ICD-10-CM | POA: Diagnosis not present

## 2015-10-25 DIAGNOSIS — M5030 Other cervical disc degeneration, unspecified cervical region: Secondary | ICD-10-CM | POA: Diagnosis not present

## 2015-10-25 DIAGNOSIS — M4722 Other spondylosis with radiculopathy, cervical region: Secondary | ICD-10-CM | POA: Diagnosis not present

## 2016-03-19 ENCOUNTER — Encounter: Payer: Self-pay | Admitting: Certified Nurse Midwife

## 2016-03-19 ENCOUNTER — Ambulatory Visit (INDEPENDENT_AMBULATORY_CARE_PROVIDER_SITE_OTHER): Payer: Medicare Other | Admitting: Certified Nurse Midwife

## 2016-03-19 VITALS — BP 118/74 | HR 64 | Resp 12 | Ht 63.0 in | Wt 119.8 lb

## 2016-03-19 DIAGNOSIS — Z0000 Encounter for general adult medical examination without abnormal findings: Secondary | ICD-10-CM | POA: Diagnosis not present

## 2016-03-19 DIAGNOSIS — Z124 Encounter for screening for malignant neoplasm of cervix: Secondary | ICD-10-CM

## 2016-03-19 DIAGNOSIS — R319 Hematuria, unspecified: Secondary | ICD-10-CM | POA: Diagnosis not present

## 2016-03-19 DIAGNOSIS — N951 Menopausal and female climacteric states: Secondary | ICD-10-CM | POA: Diagnosis not present

## 2016-03-19 DIAGNOSIS — Z01419 Encounter for gynecological examination (general) (routine) without abnormal findings: Secondary | ICD-10-CM | POA: Diagnosis not present

## 2016-03-19 LAB — POCT URINALYSIS DIPSTICK
Glucose, UA: NEGATIVE
KETONES UA: NEGATIVE
Leukocytes, UA: NEGATIVE
Nitrite, UA: NEGATIVE
PH UA: 5
PROTEIN UA: NEGATIVE
Urobilinogen, UA: NEGATIVE

## 2016-03-19 NOTE — Progress Notes (Signed)
70 y.o. G47P2002 Married  Caucasian Fe here for annual exam. Menopausal no HRT. Denies vaginal bleeding or vaginal dryness. No UTI symptoms. Working on regular exercise and yoga to stay healthy. Sees PCP Dr. Brigitte Pulse for cholesterol management,labs/aex.. No further issues with heart, off Lipitor now until labs. No HSV outbreaks in the last year. No other health issues today.   Patient's last menstrual period was 07/27/2001.          Sexually active: Yes.    The current method of family planning is post menopausal status.    Exercising: Yes.    Walk, yoga Smoker:  no  Health Maintenance: Pap:  01/17/14 WNL MMG: 09/05/15 BIRADS1 class B 3 D Colonoscopy:  due BMD:   08/30/14 Osteopenia on calcium and exercise. TDaP:  11/24/2009 Labs: Will do today.            Urine: trace blood Self breast exam: yes.    reports that she has quit smoking. She has never used smokeless tobacco. She reports that she does not drink alcohol or use drugs.  Past Medical History:  Diagnosis Date  . Anemia   . Elevated cholesterol   . No pertinent past medical history     Past Surgical History:  Procedure Laterality Date  . BACK SURGERY    . COSMETIC SURGERY     eyes & under chin    Current Outpatient Prescriptions  Medication Sig Dispense Refill  . ASPIRIN PO Take by mouth as needed.    . Calcium Carbonate-Vitamin D (CALCIUM 600 + D PO) Take 2 tablets by mouth daily.    . Cholecalciferol (VITAMIN D) 2000 UNITS tablet Take 2,000 Units by mouth daily. Doesn't take often if she takes the calcium+d    . diclofenac (VOLTAREN) 75 MG EC tablet     . fluocinonide cream (LIDEX) 0.05 % as needed.     . valACYclovir (VALTREX) 1000 MG tablet as needed.     No current facility-administered medications for this visit.     Family History  Problem Relation Age of Onset  . Cancer Father     colon  . Hypertension Father   . Stroke Father     ROS:  Pertinent items are noted in HPI.  Otherwise, a comprehensive ROS was  negative.  Exam:   BP 118/74 (BP Location: Right Arm, Patient Position: Sitting, Cuff Size: Normal)   Pulse 64   Resp 12   Ht 5\' 3"  (1.6 m)   Wt 119 lb 12.8 oz (54.3 kg)   LMP 07/27/2001   BMI 21.22 kg/m  Height: 5\' 3"  (160 cm) Ht Readings from Last 3 Encounters:  03/19/16 5\' 3"  (1.6 m)  10/08/15 5\' 3"  (1.6 m)  03/14/15 5' 3.25" (1.607 m)    General appearance: alert, cooperative and appears stated age Head: Normocephalic, without obvious abnormality, atraumatic Neck: no adenopathy, supple, symmetrical, trachea midline and thyroid normal to inspection and palpation Lungs: clear to auscultation bilaterally CVAT negative bilateral Breasts: normal appearance, no masses or tenderness, No nipple retraction or dimpling, No nipple discharge or bleeding, No axillary or supraclavicular adenopathy Heart: regular rate and rhythm Abdomen: soft, non-tender; no masses,  no organomegaly Extremities: extremities normal, atraumatic, no cyanosis or edema Skin: Skin color, texture, turgor normal. No rashes or lesions Lymph nodes: Cervical, supraclavicular, and axillary nodes normal. No abnormal inguinal nodes palpated Neurologic: Grossly normal   Pelvic: External genitalia:  no lesions  Urethra:  normal appearing urethra with no masses, tenderness or lesions  Bladder/urethral meatus non tender              Bartholin's and Skene's: normal                 Vagina: normal appearing vagina with normal color and discharge, no lesions              Cervix: no cervical motion tenderness, no lesions and normal appearance              Pap taken: Yes.   Bimanual Exam:  Uterus:  normal size, contour, position, consistency, mobility, non-tender              Adnexa: normal adnexa and no mass, fullness, tenderness               Rectovaginal: Confirms               Anus:  normal sphincter tone, no lesions  Chaperone present: yes  A:  Well Woman with normal exam  Menopausal no HRT  R/O UTI  hematuria noted, asymptomatic  Cholesterol management with PCP   P:   Reviewed health and wellness pertinent to exam  Aware of need to evaluate if vaginal bleeding  Lab Urine micro   Warning signs of UTI given.  Continue follow up with PCP as indicated  Aware of colonoscopy due will discuss with PCP  Pap smear as above    counseled on breast self exam, mammography screening, menopause, adequate intake of calcium and vitamin D, diet and exercise  return annually or prn  An After Visit Summary was printed and given to the patient.

## 2016-03-19 NOTE — Patient Instructions (Signed)

## 2016-03-20 LAB — URINALYSIS, MICROSCOPIC ONLY
Bacteria, UA: NONE SEEN [HPF]
Casts: NONE SEEN [LPF]
Squamous Epithelial / LPF: NONE SEEN [HPF] (ref <=5)
WBC UA: NONE SEEN WBC/HPF (ref <=5)
Yeast: NONE SEEN [HPF]

## 2016-03-21 NOTE — Progress Notes (Signed)
Encounter reviewed Yolanda Dick, MD   

## 2016-03-23 LAB — IPS PAP SMEAR ONLY

## 2016-03-26 DIAGNOSIS — H353112 Nonexudative age-related macular degeneration, right eye, intermediate dry stage: Secondary | ICD-10-CM | POA: Diagnosis not present

## 2016-03-26 DIAGNOSIS — H353122 Nonexudative age-related macular degeneration, left eye, intermediate dry stage: Secondary | ICD-10-CM | POA: Diagnosis not present

## 2016-04-28 DIAGNOSIS — M5030 Other cervical disc degeneration, unspecified cervical region: Secondary | ICD-10-CM | POA: Diagnosis not present

## 2016-04-28 DIAGNOSIS — M5412 Radiculopathy, cervical region: Secondary | ICD-10-CM | POA: Diagnosis not present

## 2016-04-28 DIAGNOSIS — M542 Cervicalgia: Secondary | ICD-10-CM | POA: Diagnosis not present

## 2016-04-28 DIAGNOSIS — M4722 Other spondylosis with radiculopathy, cervical region: Secondary | ICD-10-CM | POA: Diagnosis not present

## 2016-05-11 DIAGNOSIS — H01001 Unspecified blepharitis right upper eyelid: Secondary | ICD-10-CM | POA: Diagnosis not present

## 2016-05-11 DIAGNOSIS — H25013 Cortical age-related cataract, bilateral: Secondary | ICD-10-CM | POA: Diagnosis not present

## 2016-05-11 DIAGNOSIS — H524 Presbyopia: Secondary | ICD-10-CM | POA: Diagnosis not present

## 2016-05-11 DIAGNOSIS — H2513 Age-related nuclear cataract, bilateral: Secondary | ICD-10-CM | POA: Diagnosis not present

## 2016-06-05 DIAGNOSIS — E784 Other hyperlipidemia: Secondary | ICD-10-CM | POA: Diagnosis not present

## 2016-06-05 DIAGNOSIS — R7301 Impaired fasting glucose: Secondary | ICD-10-CM | POA: Diagnosis not present

## 2016-06-05 DIAGNOSIS — M859 Disorder of bone density and structure, unspecified: Secondary | ICD-10-CM | POA: Diagnosis not present

## 2016-06-10 DIAGNOSIS — M546 Pain in thoracic spine: Secondary | ICD-10-CM | POA: Diagnosis not present

## 2016-06-10 DIAGNOSIS — M859 Disorder of bone density and structure, unspecified: Secondary | ICD-10-CM | POA: Diagnosis not present

## 2016-06-10 DIAGNOSIS — K219 Gastro-esophageal reflux disease without esophagitis: Secondary | ICD-10-CM | POA: Diagnosis not present

## 2016-06-10 DIAGNOSIS — Z1389 Encounter for screening for other disorder: Secondary | ICD-10-CM | POA: Diagnosis not present

## 2016-06-10 DIAGNOSIS — Z23 Encounter for immunization: Secondary | ICD-10-CM | POA: Diagnosis not present

## 2016-06-10 DIAGNOSIS — R7301 Impaired fasting glucose: Secondary | ICD-10-CM | POA: Diagnosis not present

## 2016-06-10 DIAGNOSIS — M7520 Bicipital tendinitis, unspecified shoulder: Secondary | ICD-10-CM | POA: Diagnosis not present

## 2016-06-10 DIAGNOSIS — E784 Other hyperlipidemia: Secondary | ICD-10-CM | POA: Diagnosis not present

## 2016-06-10 DIAGNOSIS — Z6821 Body mass index (BMI) 21.0-21.9, adult: Secondary | ICD-10-CM | POA: Diagnosis not present

## 2016-06-10 DIAGNOSIS — Z0000 Encounter for general adult medical examination without abnormal findings: Secondary | ICD-10-CM | POA: Diagnosis not present

## 2016-06-10 DIAGNOSIS — Z800 Family history of malignant neoplasm of digestive organs: Secondary | ICD-10-CM | POA: Diagnosis not present

## 2016-06-10 IMAGING — NM NM MISC PROCEDURE
3 series · 18 of 18 positions shown · non-contrast
Comparison: none

[Series 1: wbr_s-proj_st stress_(id)_sa · 6.5mm · 6.51mm/px · 6 of 512 frames shown (1 of 2)]
[frame 43/512]
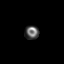
[frame 128/512]
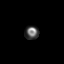
[frame 214/512]
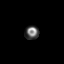
[frame 299/512]
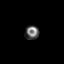
[frame 384/512]
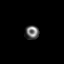
[frame 470/512]
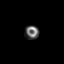

[Series 1: wbr_r-proj_st rest_(id)_sa · 6.5mm · 6.51mm/px · 6 of 64 frames shown]
[frame 6/64]
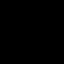
[frame 16/64]
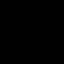
[frame 27/64]
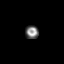
[frame 38/64]
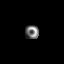
[frame 48/64]
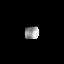
[frame 59/64]
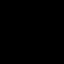

[Series 1: wbr_s-proj_st stress_(id)_sa · 6.5mm · 6.51mm/px · 6 of 64 frames shown (2 of 2)]
[frame 6/64]
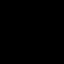
[frame 16/64]
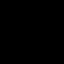
[frame 27/64]
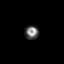
[frame 38/64]
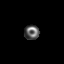
[frame 48/64]
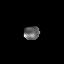
[frame 59/64]
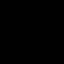

[18 of 18 positions shown; findings below may reference images not displayed]

Canned report from images found in remote index.

Refer to host system for actual result text.

## 2016-06-11 ENCOUNTER — Other Ambulatory Visit: Payer: Self-pay | Admitting: Internal Medicine

## 2016-06-11 DIAGNOSIS — M8580 Other specified disorders of bone density and structure, unspecified site: Secondary | ICD-10-CM

## 2016-06-11 DIAGNOSIS — Z1231 Encounter for screening mammogram for malignant neoplasm of breast: Secondary | ICD-10-CM

## 2016-06-16 DIAGNOSIS — Z1212 Encounter for screening for malignant neoplasm of rectum: Secondary | ICD-10-CM | POA: Diagnosis not present

## 2016-07-01 DIAGNOSIS — Z1211 Encounter for screening for malignant neoplasm of colon: Secondary | ICD-10-CM | POA: Diagnosis not present

## 2016-07-01 DIAGNOSIS — Z1212 Encounter for screening for malignant neoplasm of rectum: Secondary | ICD-10-CM | POA: Diagnosis not present

## 2016-08-26 DIAGNOSIS — L738 Other specified follicular disorders: Secondary | ICD-10-CM | POA: Diagnosis not present

## 2016-08-26 DIAGNOSIS — D224 Melanocytic nevi of scalp and neck: Secondary | ICD-10-CM | POA: Diagnosis not present

## 2016-08-26 DIAGNOSIS — L821 Other seborrheic keratosis: Secondary | ICD-10-CM | POA: Diagnosis not present

## 2016-08-26 DIAGNOSIS — D692 Other nonthrombocytopenic purpura: Secondary | ICD-10-CM | POA: Diagnosis not present

## 2016-08-26 DIAGNOSIS — D2271 Melanocytic nevi of right lower limb, including hip: Secondary | ICD-10-CM | POA: Diagnosis not present

## 2016-08-26 DIAGNOSIS — D225 Melanocytic nevi of trunk: Secondary | ICD-10-CM | POA: Diagnosis not present

## 2016-08-26 DIAGNOSIS — Z419 Encounter for procedure for purposes other than remedying health state, unspecified: Secondary | ICD-10-CM | POA: Diagnosis not present

## 2016-09-22 ENCOUNTER — Ambulatory Visit
Admission: RE | Admit: 2016-09-22 | Discharge: 2016-09-22 | Disposition: A | Discharge status: Home / Self Care | Payer: Medicare Other | Source: Ambulatory Visit | Attending: Internal Medicine | Admitting: Internal Medicine

## 2016-09-22 DIAGNOSIS — Z1231 Encounter for screening mammogram for malignant neoplasm of breast: Secondary | ICD-10-CM | POA: Diagnosis not present

## 2016-09-22 DIAGNOSIS — Z780 Asymptomatic menopausal state: Secondary | ICD-10-CM | POA: Diagnosis not present

## 2016-09-22 DIAGNOSIS — M8580 Other specified disorders of bone density and structure, unspecified site: Secondary | ICD-10-CM

## 2016-09-22 DIAGNOSIS — M85851 Other specified disorders of bone density and structure, right thigh: Secondary | ICD-10-CM | POA: Diagnosis not present

## 2016-09-24 DIAGNOSIS — H353131 Nonexudative age-related macular degeneration, bilateral, early dry stage: Secondary | ICD-10-CM | POA: Diagnosis not present

## 2016-09-30 DIAGNOSIS — E784 Other hyperlipidemia: Secondary | ICD-10-CM | POA: Diagnosis not present

## 2016-09-30 DIAGNOSIS — M859 Disorder of bone density and structure, unspecified: Secondary | ICD-10-CM | POA: Diagnosis not present

## 2016-10-27 DIAGNOSIS — M5030 Other cervical disc degeneration, unspecified cervical region: Secondary | ICD-10-CM | POA: Diagnosis not present

## 2016-10-27 DIAGNOSIS — M5412 Radiculopathy, cervical region: Secondary | ICD-10-CM | POA: Diagnosis not present

## 2016-10-27 DIAGNOSIS — M542 Cervicalgia: Secondary | ICD-10-CM | POA: Diagnosis not present

## 2016-10-27 DIAGNOSIS — M4722 Other spondylosis with radiculopathy, cervical region: Secondary | ICD-10-CM | POA: Diagnosis not present

## 2016-10-27 DIAGNOSIS — G5602 Carpal tunnel syndrome, left upper limb: Secondary | ICD-10-CM | POA: Diagnosis not present

## 2017-02-19 DIAGNOSIS — H401111 Primary open-angle glaucoma, right eye, mild stage: Secondary | ICD-10-CM | POA: Diagnosis not present

## 2017-02-19 DIAGNOSIS — H401122 Primary open-angle glaucoma, left eye, moderate stage: Secondary | ICD-10-CM | POA: Diagnosis not present

## 2017-03-24 ENCOUNTER — Ambulatory Visit (INDEPENDENT_AMBULATORY_CARE_PROVIDER_SITE_OTHER): Payer: 59 | Admitting: Certified Nurse Midwife

## 2017-03-24 ENCOUNTER — Encounter: Payer: Self-pay | Admitting: Certified Nurse Midwife

## 2017-03-24 VITALS — BP 92/62 | HR 64 | Resp 16 | Ht 62.75 in | Wt 119.0 lb

## 2017-03-24 DIAGNOSIS — N951 Menopausal and female climacteric states: Secondary | ICD-10-CM

## 2017-03-24 DIAGNOSIS — Z01419 Encounter for gynecological examination (general) (routine) without abnormal findings: Secondary | ICD-10-CM

## 2017-03-24 DIAGNOSIS — N898 Other specified noninflammatory disorders of vagina: Secondary | ICD-10-CM | POA: Diagnosis not present

## 2017-03-24 DIAGNOSIS — Z800 Family history of malignant neoplasm of digestive organs: Secondary | ICD-10-CM | POA: Diagnosis not present

## 2017-03-24 NOTE — Patient Instructions (Signed)

## 2017-03-24 NOTE — Progress Notes (Signed)
71 y.o. G37P2002 Married  Caucasian Fe here for annual exam. Menopausal no HRT. Denies vaginal bleeding or vaginal dryness.Sees PCP for aex/labs/ cholesterol.  Stays active with exercise and yoga. Still working daily and feels this keeps her busy. No health issues today.  Patient's last menstrual period was 07/27/2001.          Sexually active: Yes.    The current method of family planning is post menopausal status & husband vasectomy.   Exercising: Yes.    walking,yoga & strength training Smoker:  no  Health Maintenance: Pap:  01-17-14 neg, 03-19-16 neg History of Abnormal Pap: no MMG:  09-22-16 category c density birads 1:neg Self Breast exams: yes Colonoscopy:  2011 f/u 5-20yrs per MD 10 years BMD:   2018 TDaP:  2011 Shingles: had done Pneumonia: had done Hep C and HIV: Hep c neg per patient Labs: not needed with PCP   reports that she has quit smoking. She has never used smokeless tobacco. She reports that she does not drink alcohol or use drugs.  Past Medical History:  Diagnosis Date  . Anemia   . Elevated cholesterol   . No pertinent past medical history   . STD (sexually transmitted disease)    HSV1    Past Surgical History:  Procedure Laterality Date  . BACK SURGERY    . COSMETIC SURGERY     eyes & under chin    Current Outpatient Prescriptions  Medication Sig Dispense Refill  . aspirin 81 MG tablet Take 81 mg by mouth daily.    . Cholecalciferol (VITAMIN D) 2000 UNITS tablet Take 2,000 Units by mouth daily. Doesn't take often if she takes the calcium+d    . rosuvastatin (CRESTOR) 10 MG tablet     . valACYclovir (VALTREX) 1000 MG tablet as needed.     No current facility-administered medications for this visit.     Family History  Problem Relation Age of Onset  . Cancer Father        colon  . Hypertension Father   . Stroke Father     ROS:  Pertinent items are noted in HPI.  Otherwise, a comprehensive ROS was negative.  Exam:   BP 92/62   Pulse 64    Resp 16   Ht 5' 2.75" (1.594 m)   Wt 119 lb (54 kg)   LMP 07/27/2001   BMI 21.25 kg/m  Height: 5' 2.75" (159.4 cm) Ht Readings from Last 3 Encounters:  03/24/17 5' 2.75" (1.594 m)  03/19/16 5\' 3"  (1.6 m)  10/08/15 5\' 3"  (1.6 m)    General appearance: alert, cooperative and appears stated age Head: Normocephalic, without obvious abnormality, atraumatic Neck: no adenopathy, supple, symmetrical, trachea midline and thyroid normal to inspection and palpation Lungs: clear to auscultation bilaterally Breasts: normal appearance, no masses or tenderness, No nipple retraction or dimpling, No nipple discharge or bleeding, No axillary or supraclavicular adenopathy Heart: regular rate and rhythm Abdomen: soft, non-tender; no masses,  no organomegaly Extremities: extremities normal, atraumatic, no cyanosis or edema Skin: Skin color, texture, turgor normal. No rashes or lesions Lymph nodes: Cervical, supraclavicular, and axillary nodes normal. No abnormal inguinal nodes palpated Neurologic: Grossly normal   Pelvic: External genitalia:  no lesions              Urethra:  normal appearing urethra with no masses, tenderness or lesions              Bartholin's and Skene's: normal  Vagina: normal appearing vagina with normal color and discharge, no lesions              Cervix: no cervical motion tenderness, no lesions and normal appearance              Pap taken: No. Bimanual Exam:  Uterus:  normal size, contour, position, consistency, mobility, non-tender and anteverted              Adnexa: normal adnexa and no mass, fullness, tenderness               Rectovaginal: Confirms               Anus:  normal sphincter tone, no lesions  Chaperone present: yes  A:  Well Woman with normal exam  Menopausal no HRT  Vaginal dryness  Cholesterol management with PCP  Family history of colon cancer father      P:   Reviewed health and wellness pertinent to exam  Aware of need to advise if  vaginal bleeding or pain  Discussed options for vaginal dryness, has tried coconut oil and will work on daily use. Has worked well before with no issues.  Continue follow up with PCP as indicated  Will follow up with repeat colonoscopy as recommended  Pap smear: no   counseled on breast self exam, mammography screening, feminine hygiene, adequate intake of calcium and vitamin D, diet and exercise, Kegel's exercises  return annually or prn  An After Visit Summary was printed and given to the patient.

## 2017-03-30 DIAGNOSIS — H353131 Nonexudative age-related macular degeneration, bilateral, early dry stage: Secondary | ICD-10-CM | POA: Diagnosis not present

## 2017-03-30 DIAGNOSIS — H43812 Vitreous degeneration, left eye: Secondary | ICD-10-CM | POA: Diagnosis not present

## 2017-05-12 DIAGNOSIS — H2513 Age-related nuclear cataract, bilateral: Secondary | ICD-10-CM | POA: Diagnosis not present

## 2017-05-12 DIAGNOSIS — H5203 Hypermetropia, bilateral: Secondary | ICD-10-CM | POA: Diagnosis not present

## 2017-05-12 DIAGNOSIS — H04123 Dry eye syndrome of bilateral lacrimal glands: Secondary | ICD-10-CM | POA: Diagnosis not present

## 2017-05-12 DIAGNOSIS — H353131 Nonexudative age-related macular degeneration, bilateral, early dry stage: Secondary | ICD-10-CM | POA: Diagnosis not present

## 2017-06-15 DIAGNOSIS — R82998 Other abnormal findings in urine: Secondary | ICD-10-CM | POA: Diagnosis not present

## 2017-06-15 DIAGNOSIS — R7301 Impaired fasting glucose: Secondary | ICD-10-CM | POA: Diagnosis not present

## 2017-06-15 DIAGNOSIS — M859 Disorder of bone density and structure, unspecified: Secondary | ICD-10-CM | POA: Diagnosis not present

## 2017-06-15 DIAGNOSIS — E7849 Other hyperlipidemia: Secondary | ICD-10-CM | POA: Diagnosis not present

## 2017-06-22 DIAGNOSIS — Z0000 Encounter for general adult medical examination without abnormal findings: Secondary | ICD-10-CM | POA: Diagnosis not present

## 2017-06-22 DIAGNOSIS — R7301 Impaired fasting glucose: Secondary | ICD-10-CM | POA: Diagnosis not present

## 2017-06-22 DIAGNOSIS — Z6821 Body mass index (BMI) 21.0-21.9, adult: Secondary | ICD-10-CM | POA: Diagnosis not present

## 2017-06-22 DIAGNOSIS — Z1389 Encounter for screening for other disorder: Secondary | ICD-10-CM | POA: Diagnosis not present

## 2017-06-22 DIAGNOSIS — Z23 Encounter for immunization: Secondary | ICD-10-CM | POA: Diagnosis not present

## 2017-06-22 DIAGNOSIS — E7849 Other hyperlipidemia: Secondary | ICD-10-CM | POA: Diagnosis not present

## 2017-06-22 DIAGNOSIS — M8580 Other specified disorders of bone density and structure, unspecified site: Secondary | ICD-10-CM | POA: Diagnosis not present

## 2017-06-22 DIAGNOSIS — Z800 Family history of malignant neoplasm of digestive organs: Secondary | ICD-10-CM | POA: Diagnosis not present

## 2017-06-22 DIAGNOSIS — M546 Pain in thoracic spine: Secondary | ICD-10-CM | POA: Diagnosis not present

## 2017-06-22 DIAGNOSIS — M2550 Pain in unspecified joint: Secondary | ICD-10-CM | POA: Diagnosis not present

## 2017-06-22 DIAGNOSIS — I2510 Atherosclerotic heart disease of native coronary artery without angina pectoris: Secondary | ICD-10-CM | POA: Diagnosis not present

## 2017-07-30 DIAGNOSIS — M5412 Radiculopathy, cervical region: Secondary | ICD-10-CM | POA: Diagnosis not present

## 2017-07-30 DIAGNOSIS — M5030 Other cervical disc degeneration, unspecified cervical region: Secondary | ICD-10-CM | POA: Diagnosis not present

## 2017-07-30 DIAGNOSIS — R030 Elevated blood-pressure reading, without diagnosis of hypertension: Secondary | ICD-10-CM | POA: Diagnosis not present

## 2017-07-30 DIAGNOSIS — M4722 Other spondylosis with radiculopathy, cervical region: Secondary | ICD-10-CM | POA: Diagnosis not present

## 2017-07-30 DIAGNOSIS — G5603 Carpal tunnel syndrome, bilateral upper limbs: Secondary | ICD-10-CM | POA: Diagnosis not present

## 2017-08-09 ENCOUNTER — Other Ambulatory Visit: Payer: Self-pay | Admitting: Internal Medicine

## 2017-08-09 DIAGNOSIS — Z139 Encounter for screening, unspecified: Secondary | ICD-10-CM

## 2017-09-23 ENCOUNTER — Ambulatory Visit
Admission: RE | Admit: 2017-09-23 | Discharge: 2017-09-23 | Disposition: A | Discharge status: Home / Self Care | Payer: Medicare Other | Source: Ambulatory Visit | Attending: Internal Medicine | Admitting: Internal Medicine

## 2017-09-23 DIAGNOSIS — Z139 Encounter for screening, unspecified: Secondary | ICD-10-CM

## 2017-10-12 DIAGNOSIS — H353131 Nonexudative age-related macular degeneration, bilateral, early dry stage: Secondary | ICD-10-CM | POA: Diagnosis not present

## 2017-10-12 DIAGNOSIS — H43813 Vitreous degeneration, bilateral: Secondary | ICD-10-CM | POA: Diagnosis not present

## 2017-10-20 DIAGNOSIS — L738 Other specified follicular disorders: Secondary | ICD-10-CM | POA: Diagnosis not present

## 2017-10-20 DIAGNOSIS — Z419 Encounter for procedure for purposes other than remedying health state, unspecified: Secondary | ICD-10-CM | POA: Diagnosis not present

## 2017-10-20 DIAGNOSIS — D2239 Melanocytic nevi of other parts of face: Secondary | ICD-10-CM | POA: Diagnosis not present

## 2017-10-20 DIAGNOSIS — L821 Other seborrheic keratosis: Secondary | ICD-10-CM | POA: Diagnosis not present

## 2017-10-20 DIAGNOSIS — L820 Inflamed seborrheic keratosis: Secondary | ICD-10-CM | POA: Diagnosis not present

## 2017-10-20 DIAGNOSIS — D2271 Melanocytic nevi of right lower limb, including hip: Secondary | ICD-10-CM | POA: Diagnosis not present

## 2017-10-20 DIAGNOSIS — D485 Neoplasm of uncertain behavior of skin: Secondary | ICD-10-CM | POA: Diagnosis not present

## 2017-10-20 DIAGNOSIS — L218 Other seborrheic dermatitis: Secondary | ICD-10-CM | POA: Diagnosis not present

## 2018-02-21 DIAGNOSIS — D485 Neoplasm of uncertain behavior of skin: Secondary | ICD-10-CM | POA: Diagnosis not present

## 2018-03-30 ENCOUNTER — Ambulatory Visit (INDEPENDENT_AMBULATORY_CARE_PROVIDER_SITE_OTHER): Payer: 59 | Admitting: Certified Nurse Midwife

## 2018-03-30 ENCOUNTER — Other Ambulatory Visit: Payer: Self-pay

## 2018-03-30 ENCOUNTER — Encounter: Payer: Self-pay | Admitting: Certified Nurse Midwife

## 2018-03-30 VITALS — BP 100/62 | HR 68 | Resp 16 | Ht 62.75 in | Wt 125.0 lb

## 2018-03-30 DIAGNOSIS — N952 Postmenopausal atrophic vaginitis: Secondary | ICD-10-CM | POA: Diagnosis not present

## 2018-03-30 DIAGNOSIS — Z780 Asymptomatic menopausal state: Secondary | ICD-10-CM | POA: Diagnosis not present

## 2018-03-30 DIAGNOSIS — Z01419 Encounter for gynecological examination (general) (routine) without abnormal findings: Secondary | ICD-10-CM

## 2018-03-30 NOTE — Progress Notes (Signed)
72 y.o. G56P2002 Married  Caucasian Fe here for annual exam.Menopausal no vaginal bleeding or vaginal dryness. Has noted the need to urinate more at night only gets up randomly once nightly. Denies urinary frequency or urgency or pain with urination. Has noted small amount of weight gain, but exercising daily. Sees PCP in 11/19 for aex/ labs/cholesterol management. All stable per patient. No HSV outbreaks recently. No other health issues today. Still enjoying working and traveling outside of [place]!  Patient's last menstrual period was 07/27/2001.          Sexually active: Yes.    The current method of family planning is post menopausal status & husband vasectomy.    Exercising: Yes.    walk, yoga, workout with trainer Smoker:  no  Review of Systems  Constitutional: Negative.   HENT: Negative.   Eyes: Negative.   Respiratory: Negative.   Cardiovascular: Negative.   Gastrointestinal: Negative.   Genitourinary: Negative.   Musculoskeletal: Negative.   Skin: Negative.   Neurological: Negative.   Endo/Heme/Allergies: Negative.   Psychiatric/Behavioral: Negative.     Health Maintenance: Pap:  03-19-16 neg History of Abnormal Pap: no MMG:  09-23-17 category c density birads 1:neg Self Breast exams: yes Colonoscopy:  2011 f/u usually 71yrs due to family history, MD said f/u in 69yrs BMD:   2018 osteopenia  R hip only TDaP:  2011 Shingles: had older series done Pneumonia: had done Hep C and HIV: hep c neg per patient Labs: with PCP   reports that she has quit smoking. She has never used smokeless tobacco. She reports that she drinks about 4.0 standard drinks of alcohol per week. She reports that she does not use drugs.  Past Medical History:  Diagnosis Date  . Anemia   . Elevated cholesterol   . No pertinent past medical history   . STD (sexually transmitted disease)    HSV1    Past Surgical History:  Procedure Laterality Date  . BACK SURGERY    . COSMETIC SURGERY     eyes & under  chin    Current Outpatient Medications  Medication Sig Dispense Refill  . Cholecalciferol (VITAMIN D) 2000 UNITS tablet Take 2,000 Units by mouth daily. occ    . rosuvastatin (CRESTOR) 10 MG tablet     . valACYclovir (VALTREX) 1000 MG tablet as needed.     No current facility-administered medications for this visit.     Family History  Problem Relation Age of Onset  . Cancer Father        colon  . Hypertension Father   . Stroke Father   . Breast cancer Neg Hx     ROS:  Pertinent items are noted in HPI.  Otherwise, a comprehensive ROS was negative.  Exam:   BP 100/62   Pulse 68   Resp 16   Ht 5' 2.75" (1.594 m)   Wt 125 lb (56.7 kg)   LMP 07/27/2001   BMI 22.32 kg/m  Height: 5' 2.75" (159.4 cm) Ht Readings from Last 3 Encounters:  03/30/18 5' 2.75" (1.594 m)  03/24/17 5' 2.75" (1.594 m)  03/19/16 5\' 3"  (1.6 m)    General appearance: alert, cooperative and appears stated age Head: Normocephalic, without obvious abnormality, atraumatic Neck: no adenopathy, supple, symmetrical, trachea midline and thyroid normal to inspection and palpation Lungs: clear to auscultation bilaterally Breasts: normal appearance, no masses or tenderness, No nipple retraction or dimpling, No nipple discharge or bleeding, No axillary or supraclavicular adenopathy Heart: regular rate and  rhythm Abdomen: soft, non-tender; no masses,  no organomegaly Extremities: extremities normal, atraumatic, no cyanosis or edema Skin: Skin color, texture, turgor normal. No rashes or lesions Lymph nodes: Cervical, supraclavicular, and axillary nodes normal. No abnormal inguinal nodes palpated Neurologic: Grossly normal   Pelvic: External genitalia:  no lesions, normal female              Urethra:  normal appearing urethra with no masses, tenderness or lesions, Bladder non tender,  Urethral meatus slightly red, non tender, dry appearance around area              Bartholin's and Skene's: normal                  Vagina: normal appearing vagina with normal color and discharge, no lesions              Cervix: multiparous appearance, no bleeding following Pap and no lesions              Pap taken: No. Bimanual Exam:  Uterus:  normal size, contour, position, consistency, mobility, non-tender              Adnexa: normal adnexa and no mass, fullness, tenderness               Rectovaginal: Confirms               Anus:  normal sphincter tone, no lesions  Chaperone present: yes  A:  Well Woman with normal exam  Post menopausal  Atrophic vaginitis  Urinary urgency only related to atrophic vaginitis  Cholesterol with PCP management  History of HSV, no Rx update needed  P:   Reviewed health and wellness pertinent to exam  Aware of need to advise if vaginal bleeding  Discussed finding and need for routine moisture. Discussed OTC products Replens, Olive or coconut oil use. Discussed relationship to urinary urgency. questions addressed. Will advise if issues continue.  Continue with MD follow up as indicated.   Pap smear: no   counseled on breast self exam, mammography screening, feminine hygiene, adequate intake of calcium and vitamin D, diet and exercise  return annually or prn  An After Visit Summary was printed and given to the patient.

## 2018-03-30 NOTE — Patient Instructions (Signed)
EXERCISE AND DIET:  We recommended that you start or continue a regular exercise program for good health. Regular exercise means any activity that makes your heart beat faster and makes you sweat.  We recommend exercising at least 30 minutes per day at least 3 days a week, preferably 4 or 5.  We also recommend a diet low in fat and sugar.  Inactivity, poor dietary choices and obesity can cause diabetes, heart attack, stroke, and kidney damage, among others.    ALCOHOL AND SMOKING:  Women should limit their alcohol intake to no more than 7 drinks/beers/glasses of wine (combined, not each!) per week. Moderation of alcohol intake to this level decreases your risk of breast cancer and liver damage. And of course, no recreational drugs are part of a healthy lifestyle.  And absolutely no smoking or even second hand smoke. Most people know smoking can cause heart and lung diseases, but did you know it also contributes to weakening of your bones? Aging of your skin?  Yellowing of your teeth and nails?  CALCIUM AND VITAMIN D:  Adequate intake of calcium and Vitamin D are recommended.  The recommendations for exact amounts of these supplements seem to change often, but generally speaking 600 mg of calcium (either carbonate or citrate) and 800 units of Vitamin D per day seems prudent. Certain women may benefit from higher intake of Vitamin D.  If you are among these women, your doctor will have told you during your visit.    PAP SMEARS:  Pap smears, to check for cervical cancer or precancers,  have traditionally been done yearly, although recent scientific advances have shown that most women can have pap smears less often.  However, every woman still should have a physical exam from her gynecologist every year. It will include a breast check, inspection of the vulva and vagina to check for abnormal growths or skin changes, a visual exam of the cervix, and then an exam to evaluate the size and shape of the uterus and  ovaries.  And after 70 years of age, a rectal exam is indicated to check for rectal cancers. We will also provide age appropriate advice regarding health maintenance, like when you should have certain vaccines, screening for sexually transmitted diseases, bone density testing, colonoscopy, mammograms, etc.   MAMMOGRAMS:  All women over 55 years old should have a yearly mammogram. Many facilities now offer a "3D" mammogram, which may cost around $50 extra out of pocket. If possible,  we recommend you accept the option to have the 3D mammogram performed.  It both reduces the number of women who will be called back for extra views which then turn out to be normal, and it is better than the routine mammogram at detecting truly abnormal areas.    COLONOSCOPY:  Colonoscopy to screen for colon cancer is recommended for all women at age 12.  We know, you hate the idea of the prep.  We agree, BUT, having colon cancer and not knowing it is worse!!  Colon cancer so often starts as a polyp that can be seen and removed at colonscopy, which can quite literally save your life!  And if your first colonoscopy is normal and you have no family history of colon cancer, most women don't have to have it again for 10 years.  Once every ten years, you can do something that may end up saving your life, right?  We will be happy to help you get it scheduled when you are ready.  Be sure to check your insurance coverage so you understand how much it will cost.  It may be covered as a preventative service at no cost, but you should check your particular policy.      Atrophic Vaginitis Atrophic vaginitis is when the tissues that line the vagina become dry and thin. This is caused by a drop in estrogen. Estrogen helps:  To keep the vagina moist.  To make a clear fluid that helps: ? To lubricate the vagina for sex. ? To protect the vagina from infection.  If the lining of the vagina is dry and thin, it may:  Make sex painful. It  may also cause bleeding.  Cause a feeling of: ? Burning. ? Irritation. ? Itchiness.  Make an exam of your vagina painful. It may also cause bleeding.  Make you lose interest in sex.  Cause a burning feeling when you pee.  Make your vaginal fluid (discharge) brown or yellow.  For some women, there are no symptoms. This condition is most common in women who do not get their regular menstrual periods anymore (menopause). This often starts when a woman is 45-55 years old. Follow these instructions at home:  Take medicines only as told by your doctor. Do not use any herbal or alternative medicines unless your doctor says it is okay.  Use over-the-counter products for dryness only as told by your doctor. These include: ? Creams. ? Lubricants. ? Moisturizers.  Do not douche.  Do not use products that can make your vagina dry. These include: ? Scented feminine sprays. ? Scented tampons. ? Scented soaps.  If it hurts to have sex, tell your sexual partner. Contact a doctor if:  Your discharge looks different than normal.  Your vagina has an unusual smell.  You have new symptoms.  Your symptoms do not get better with treatment.  Your symptoms get worse. This information is not intended to replace advice given to you by your health care provider. Make sure you discuss any questions you have with your health care provider. Document Released: 12/30/2007 Document Revised: 12/19/2015 Document Reviewed: 07/04/2014 [employer] Interactive Patient Education  2018 [employer]  

## 2018-04-19 DIAGNOSIS — H353122 Nonexudative age-related macular degeneration, left eye, intermediate dry stage: Secondary | ICD-10-CM | POA: Diagnosis not present

## 2018-04-19 DIAGNOSIS — H43813 Vitreous degeneration, bilateral: Secondary | ICD-10-CM | POA: Diagnosis not present

## 2018-04-19 DIAGNOSIS — H353211 Exudative age-related macular degeneration, right eye, with active choroidal neovascularization: Secondary | ICD-10-CM | POA: Diagnosis not present

## 2018-05-16 DIAGNOSIS — H524 Presbyopia: Secondary | ICD-10-CM | POA: Diagnosis not present

## 2018-05-16 DIAGNOSIS — H2513 Age-related nuclear cataract, bilateral: Secondary | ICD-10-CM | POA: Diagnosis not present

## 2018-05-16 DIAGNOSIS — H353211 Exudative age-related macular degeneration, right eye, with active choroidal neovascularization: Secondary | ICD-10-CM | POA: Diagnosis not present

## 2018-05-16 DIAGNOSIS — H25013 Cortical age-related cataract, bilateral: Secondary | ICD-10-CM | POA: Diagnosis not present

## 2018-05-24 DIAGNOSIS — H353211 Exudative age-related macular degeneration, right eye, with active choroidal neovascularization: Secondary | ICD-10-CM | POA: Diagnosis not present

## 2018-06-17 DIAGNOSIS — R82998 Other abnormal findings in urine: Secondary | ICD-10-CM | POA: Diagnosis not present

## 2018-06-21 DIAGNOSIS — H353211 Exudative age-related macular degeneration, right eye, with active choroidal neovascularization: Secondary | ICD-10-CM | POA: Diagnosis not present

## 2018-06-27 DIAGNOSIS — R7301 Impaired fasting glucose: Secondary | ICD-10-CM | POA: Diagnosis not present

## 2018-06-27 DIAGNOSIS — I2510 Atherosclerotic heart disease of native coronary artery without angina pectoris: Secondary | ICD-10-CM | POA: Diagnosis not present

## 2018-06-27 DIAGNOSIS — E7849 Other hyperlipidemia: Secondary | ICD-10-CM | POA: Diagnosis not present

## 2018-06-27 DIAGNOSIS — Z0000 Encounter for general adult medical examination without abnormal findings: Secondary | ICD-10-CM | POA: Diagnosis not present

## 2018-06-27 DIAGNOSIS — Z23 Encounter for immunization: Secondary | ICD-10-CM | POA: Diagnosis not present

## 2018-06-27 DIAGNOSIS — H3530 Unspecified macular degeneration: Secondary | ICD-10-CM | POA: Diagnosis not present

## 2018-06-27 DIAGNOSIS — Z800 Family history of malignant neoplasm of digestive organs: Secondary | ICD-10-CM | POA: Diagnosis not present

## 2018-06-27 DIAGNOSIS — M859 Disorder of bone density and structure, unspecified: Secondary | ICD-10-CM | POA: Diagnosis not present

## 2018-06-27 DIAGNOSIS — Z6821 Body mass index (BMI) 21.0-21.9, adult: Secondary | ICD-10-CM | POA: Diagnosis not present

## 2018-07-05 DIAGNOSIS — H353212 Exudative age-related macular degeneration, right eye, with inactive choroidal neovascularization: Secondary | ICD-10-CM | POA: Diagnosis not present

## 2018-07-05 DIAGNOSIS — H353122 Nonexudative age-related macular degeneration, left eye, intermediate dry stage: Secondary | ICD-10-CM | POA: Diagnosis not present

## 2018-07-05 DIAGNOSIS — H2513 Age-related nuclear cataract, bilateral: Secondary | ICD-10-CM | POA: Diagnosis not present

## 2018-07-05 DIAGNOSIS — H43813 Vitreous degeneration, bilateral: Secondary | ICD-10-CM | POA: Diagnosis not present

## 2018-07-27 HISTORY — PX: BREAST BIOPSY: SHX20

## 2018-07-28 DIAGNOSIS — M5412 Radiculopathy, cervical region: Secondary | ICD-10-CM | POA: Diagnosis not present

## 2018-07-28 DIAGNOSIS — G5603 Carpal tunnel syndrome, bilateral upper limbs: Secondary | ICD-10-CM | POA: Diagnosis not present

## 2018-07-28 DIAGNOSIS — M5030 Other cervical disc degeneration, unspecified cervical region: Secondary | ICD-10-CM | POA: Diagnosis not present

## 2018-07-28 DIAGNOSIS — M4722 Other spondylosis with radiculopathy, cervical region: Secondary | ICD-10-CM | POA: Diagnosis not present

## 2018-07-28 DIAGNOSIS — M542 Cervicalgia: Secondary | ICD-10-CM | POA: Diagnosis not present

## 2018-08-19 DIAGNOSIS — H2513 Age-related nuclear cataract, bilateral: Secondary | ICD-10-CM | POA: Diagnosis not present

## 2018-08-19 DIAGNOSIS — H43813 Vitreous degeneration, bilateral: Secondary | ICD-10-CM | POA: Diagnosis not present

## 2018-08-19 DIAGNOSIS — H353122 Nonexudative age-related macular degeneration, left eye, intermediate dry stage: Secondary | ICD-10-CM | POA: Diagnosis not present

## 2018-08-19 DIAGNOSIS — H353211 Exudative age-related macular degeneration, right eye, with active choroidal neovascularization: Secondary | ICD-10-CM | POA: Diagnosis not present

## 2018-08-31 ENCOUNTER — Other Ambulatory Visit: Payer: Self-pay | Admitting: Internal Medicine

## 2018-08-31 DIAGNOSIS — Z1231 Encounter for screening mammogram for malignant neoplasm of breast: Secondary | ICD-10-CM

## 2018-09-08 DIAGNOSIS — R69 Illness, unspecified: Secondary | ICD-10-CM | POA: Diagnosis not present

## 2018-09-19 DIAGNOSIS — L820 Inflamed seborrheic keratosis: Secondary | ICD-10-CM | POA: Diagnosis not present

## 2018-09-19 DIAGNOSIS — D2239 Melanocytic nevi of other parts of face: Secondary | ICD-10-CM | POA: Diagnosis not present

## 2018-09-19 DIAGNOSIS — D224 Melanocytic nevi of scalp and neck: Secondary | ICD-10-CM | POA: Diagnosis not present

## 2018-09-19 DIAGNOSIS — L821 Other seborrheic keratosis: Secondary | ICD-10-CM | POA: Diagnosis not present

## 2018-09-19 DIAGNOSIS — L738 Other specified follicular disorders: Secondary | ICD-10-CM | POA: Diagnosis not present

## 2018-09-26 ENCOUNTER — Ambulatory Visit
Admission: RE | Admit: 2018-09-26 | Discharge: 2018-09-26 | Disposition: A | Discharge status: Home / Self Care | Payer: Medicare HMO | Source: Ambulatory Visit | Attending: Internal Medicine | Admitting: Internal Medicine

## 2018-09-26 DIAGNOSIS — Z1231 Encounter for screening mammogram for malignant neoplasm of breast: Secondary | ICD-10-CM

## 2018-09-27 ENCOUNTER — Other Ambulatory Visit: Payer: Self-pay | Admitting: Internal Medicine

## 2018-09-27 DIAGNOSIS — R928 Other abnormal and inconclusive findings on diagnostic imaging of breast: Secondary | ICD-10-CM

## 2018-09-30 ENCOUNTER — Other Ambulatory Visit: Payer: Self-pay | Admitting: Internal Medicine

## 2018-09-30 ENCOUNTER — Ambulatory Visit
Admission: RE | Admit: 2018-09-30 | Discharge: 2018-09-30 | Disposition: A | Discharge status: Home / Self Care | Payer: Medicare HMO | Source: Ambulatory Visit | Attending: Internal Medicine | Admitting: Internal Medicine

## 2018-09-30 DIAGNOSIS — R928 Other abnormal and inconclusive findings on diagnostic imaging of breast: Secondary | ICD-10-CM

## 2018-09-30 DIAGNOSIS — R921 Mammographic calcification found on diagnostic imaging of breast: Secondary | ICD-10-CM

## 2018-09-30 DIAGNOSIS — H43391 Other vitreous opacities, right eye: Secondary | ICD-10-CM | POA: Diagnosis not present

## 2018-09-30 DIAGNOSIS — H353211 Exudative age-related macular degeneration, right eye, with active choroidal neovascularization: Secondary | ICD-10-CM | POA: Diagnosis not present

## 2018-09-30 DIAGNOSIS — H353122 Nonexudative age-related macular degeneration, left eye, intermediate dry stage: Secondary | ICD-10-CM | POA: Diagnosis not present

## 2018-09-30 DIAGNOSIS — H43813 Vitreous degeneration, bilateral: Secondary | ICD-10-CM | POA: Diagnosis not present

## 2018-10-04 ENCOUNTER — Ambulatory Visit
Admission: RE | Admit: 2018-10-04 | Discharge: 2018-10-04 | Disposition: A | Discharge status: Home / Self Care | Payer: Medicare HMO | Source: Ambulatory Visit | Attending: Internal Medicine | Admitting: Internal Medicine

## 2018-10-04 DIAGNOSIS — R921 Mammographic calcification found on diagnostic imaging of breast: Secondary | ICD-10-CM

## 2018-10-04 DIAGNOSIS — N6011 Diffuse cystic mastopathy of right breast: Secondary | ICD-10-CM | POA: Diagnosis not present

## 2018-10-28 DIAGNOSIS — H353211 Exudative age-related macular degeneration, right eye, with active choroidal neovascularization: Secondary | ICD-10-CM | POA: Diagnosis not present

## 2018-11-25 DIAGNOSIS — H353211 Exudative age-related macular degeneration, right eye, with active choroidal neovascularization: Secondary | ICD-10-CM | POA: Diagnosis not present

## 2018-12-23 DIAGNOSIS — H353211 Exudative age-related macular degeneration, right eye, with active choroidal neovascularization: Secondary | ICD-10-CM | POA: Diagnosis not present

## 2019-01-20 DIAGNOSIS — H353122 Nonexudative age-related macular degeneration, left eye, intermediate dry stage: Secondary | ICD-10-CM | POA: Diagnosis not present

## 2019-01-20 DIAGNOSIS — H43391 Other vitreous opacities, right eye: Secondary | ICD-10-CM | POA: Diagnosis not present

## 2019-01-20 DIAGNOSIS — H353211 Exudative age-related macular degeneration, right eye, with active choroidal neovascularization: Secondary | ICD-10-CM | POA: Diagnosis not present

## 2019-01-20 DIAGNOSIS — H43813 Vitreous degeneration, bilateral: Secondary | ICD-10-CM | POA: Diagnosis not present

## 2019-02-17 DIAGNOSIS — H43391 Other vitreous opacities, right eye: Secondary | ICD-10-CM | POA: Diagnosis not present

## 2019-02-17 DIAGNOSIS — H353122 Nonexudative age-related macular degeneration, left eye, intermediate dry stage: Secondary | ICD-10-CM | POA: Diagnosis not present

## 2019-02-17 DIAGNOSIS — H353211 Exudative age-related macular degeneration, right eye, with active choroidal neovascularization: Secondary | ICD-10-CM | POA: Diagnosis not present

## 2019-02-17 DIAGNOSIS — H43813 Vitreous degeneration, bilateral: Secondary | ICD-10-CM | POA: Diagnosis not present

## 2019-03-31 DIAGNOSIS — H43391 Other vitreous opacities, right eye: Secondary | ICD-10-CM | POA: Diagnosis not present

## 2019-03-31 DIAGNOSIS — H353122 Nonexudative age-related macular degeneration, left eye, intermediate dry stage: Secondary | ICD-10-CM | POA: Diagnosis not present

## 2019-03-31 DIAGNOSIS — H43813 Vitreous degeneration, bilateral: Secondary | ICD-10-CM | POA: Diagnosis not present

## 2019-03-31 DIAGNOSIS — H353211 Exudative age-related macular degeneration, right eye, with active choroidal neovascularization: Secondary | ICD-10-CM | POA: Diagnosis not present

## 2019-04-05 NOTE — Progress Notes (Addendum)
73 y.o. G38P2002 Married  Caucasian Fe here for annual exam. Menopausal no vaginal dryness or vaginal bleeding.Noted x 1 slight tan on tissue only, no blood or dark red or brown. ? Vaginal infection, but no itching or burning or discharge. Having some leakage not cough or sneeze related. Only has occurred 3-4 times in the past year. Works on [employer] exercise and vaginal moisture as needed. Weight loss with walking on stairs. Sees Dr. Brigitte Pulse once yearly for labs and cholesterol/Vitamin D management.  No HSV 1 outbreaks, but needs Rx updated.. Macular degeneration in right eye with injections now. Seeing specialist. No other health issues today.  Patient's last menstrual period was 07/27/2001.          Sexually active: Yes.    The current method of family planning is vasectomy and post menopausal status.    Exercising: Yes.    walking & yoga Smoker:  no  Review of Systems  Constitutional: Negative.   HENT: Negative.   Eyes: Negative.   Respiratory: Negative.   Cardiovascular: Negative.   Gastrointestinal: Negative.   Genitourinary:       Occ urine leakage with cough or sneeze  Musculoskeletal: Negative.   Skin:       Tan color discharge on toilet tissue  Neurological: Negative.   Endo/Heme/Allergies: Negative.   Psychiatric/Behavioral: Negative.     Health Maintenance: Pap:  03-19-16 neg History of Abnormal Pap: no MMG:  09/2018 breast biopsy see reports Self Breast exams: yes Colonoscopy:  2011 f/u usually in 24yrs, Per MD f/u in 43yrs BMD:   2018 osteopenia in rt hip only TDaP:  2011 Shingles: had done Pneumonia: had done Hep C and HIV: hep c neg per patient Labs: if needed   reports that she has quit smoking. She has never used smokeless tobacco. She reports current alcohol use of about 4.0 standard drinks of alcohol per week. She reports that she does not use drugs.  Past Medical History:  Diagnosis Date  . Anemia   . Elevated cholesterol   . Macular degeneration   . No  pertinent past medical history   . STD (sexually transmitted disease)    HSV1    Past Surgical History:  Procedure Laterality Date  . BACK SURGERY    . BREAST BIOPSY  2020  . COSMETIC SURGERY     eyes & under chin    Current Outpatient Medications  Medication Sig Dispense Refill  . Cholecalciferol (VITAMIN D) 2000 UNITS tablet Take 2,000 Units by mouth daily.     . Multiple Vitamins-Minerals (PRESERVISION AREDS 2 PO) Take by mouth.    . Propylene Glycol (SYSTANE BALANCE OP) Apply to eye.    . rosuvastatin (CRESTOR) 10 MG tablet     . tobramycin (TOBREX) 0.3 % ophthalmic solution     . valACYclovir (VALTREX) 1000 MG tablet as needed.     No current facility-administered medications for this visit.     Family History  Problem Relation Age of Onset  . Cancer Father        colon  . Hypertension Father   . Stroke Father     ROS:  Pertinent items are noted in HPI.  Otherwise, a comprehensive ROS was negative.  Exam:   BP 100/64   Pulse 60   Temp (!) 97.2 F (36.2 C) (Skin)   Resp 16   Ht 5' 2.75" (1.594 m)   Wt 119 lb (54 kg)   LMP 07/27/2001   BMI 21.25 kg/m  Height: 5' 2.75" (159.4 cm) Ht Readings from Last 3 Encounters:  04/07/19 5' 2.75" (1.594 m)  03/30/18 5' 2.75" (1.594 m)  03/24/17 5' 2.75" (1.594 m)    General appearance: alert, cooperative and appears stated age Head: Normocephalic, without obvious abnormality, atraumatic Neck: no adenopathy, supple, symmetrical, trachea midline and thyroid normal to inspection and palpation Lungs: clear to auscultation bilaterally Breasts: normal appearance, no masses or tenderness, No nipple retraction or dimpling, No nipple discharge or bleeding, No axillary or supraclavicular adenopathy Heart: regular rate and rhythm Abdomen: soft, non-tender; no masses,  no organomegaly Extremities: extremities normal, atraumatic, no cyanosis or edema Skin: Skin color, texture, turgor normal. No rashes or lesions Lymph nodes:  Cervical, supraclavicular, and axillary nodes normal. No abnormal inguinal nodes palpated Neurologic: Grossly normal   Pelvic: External genitalia:  no lesions              Urethra:  normal appearing urethra with no masses, tenderness or lesions              Bartholin's and Skene's: normal                 Vagina: normal appearing vagina with normal color and discharge, no lesions              Cervix: no bleeding following Pap, no cervical motion tenderness and normal appearance              Pap taken: Yes.   Bimanual Exam:  Uterus:  normal size, contour, position, consistency, mobility, non-tender and anteverted              Adnexa: normal adnexa and no mass, fullness, tenderness               Rectovaginal: Confirms               Anus:  normal sphincter tone, no lesions  Chaperone present: yes  A:  Well Woman with normal exam  Post menopausal no HRT  Atrophic vaginitis  History of HSV 1 needs Rx update  Breast biopsy from mammogram, benign  Cholesterol management with PCP  P:   Reviewed health and wellness pertinent to exam  Aware of need to advise if vaginal bleeding  Discussed finding and options for management with coconut oil or OTC Replens. Will try and advise if no change. Will do affirm to make sure no infection.  Lab: affirm  Rx Valtrex see order with instructions  Stressed SBE and mammogram follow up as indicated  Continue follow up with PCP as indicated  Pap smear: yes   counseled on breast self exam, feminine hygiene, menopause, osteoporosis, adequate intake of calcium and vitamin D, diet and exercise  return annually or prn  An After Visit Summary was printed and given to the patient.

## 2019-04-07 ENCOUNTER — Other Ambulatory Visit (HOSPITAL_COMMUNITY)
Admission: RE | Admit: 2019-04-07 | Discharge: 2019-04-07 | Disposition: A | Discharge status: Home / Self Care | Payer: Medicare HMO | Source: Ambulatory Visit | Attending: Certified Nurse Midwife | Admitting: Certified Nurse Midwife

## 2019-04-07 ENCOUNTER — Ambulatory Visit (INDEPENDENT_AMBULATORY_CARE_PROVIDER_SITE_OTHER): Payer: Medicare HMO | Admitting: Certified Nurse Midwife

## 2019-04-07 ENCOUNTER — Other Ambulatory Visit: Payer: Self-pay

## 2019-04-07 ENCOUNTER — Encounter: Payer: Self-pay | Admitting: Certified Nurse Midwife

## 2019-04-07 VITALS — BP 100/64 | HR 60 | Temp 97.2°F | Resp 16 | Ht 62.75 in | Wt 119.0 lb

## 2019-04-07 DIAGNOSIS — Z01419 Encounter for gynecological examination (general) (routine) without abnormal findings: Secondary | ICD-10-CM | POA: Diagnosis not present

## 2019-04-07 DIAGNOSIS — Z124 Encounter for screening for malignant neoplasm of cervix: Secondary | ICD-10-CM | POA: Diagnosis not present

## 2019-04-07 DIAGNOSIS — N952 Postmenopausal atrophic vaginitis: Secondary | ICD-10-CM

## 2019-04-07 DIAGNOSIS — Z8619 Personal history of other infectious and parasitic diseases: Secondary | ICD-10-CM | POA: Diagnosis not present

## 2019-04-07 DIAGNOSIS — Z1151 Encounter for screening for human papillomavirus (HPV): Secondary | ICD-10-CM | POA: Diagnosis not present

## 2019-04-07 DIAGNOSIS — R69 Illness, unspecified: Secondary | ICD-10-CM | POA: Diagnosis not present

## 2019-04-07 MED ORDER — VALACYCLOVIR HCL 1 G PO TABS: ORAL_TABLET | ORAL | 2 refills | Status: DC

## 2019-04-07 NOTE — Patient Instructions (Signed)
EXERCISE AND DIET:  We recommended that you start or continue a regular exercise program for good health. Regular exercise means any activity that makes your heart beat faster and makes you sweat.  We recommend exercising at least 30 minutes per day at least 3 days a week, preferably 4 or 5.  We also recommend a diet low in fat and sugar.  Inactivity, poor dietary choices and obesity can cause diabetes, heart attack, stroke, and kidney damage, among others.   ° °ALCOHOL AND SMOKING:  Women should limit their alcohol intake to no more than 7 drinks/beers/glasses of wine (combined, not each!) per week. Moderation of alcohol intake to this level decreases your risk of breast cancer and liver damage. And of course, no recreational drugs are part of a healthy lifestyle.  And absolutely no smoking or even second hand smoke. Most people know smoking can cause heart and lung diseases, but did you know it also contributes to weakening of your bones? Aging of your skin?  Yellowing of your teeth and nails? ° °CALCIUM AND VITAMIN D:  Adequate intake of calcium and Vitamin D are recommended.  The recommendations for exact amounts of these supplements seem to change often, but generally speaking 600 mg of calcium (either carbonate or citrate) and 800 units of Vitamin D per day seems prudent. Certain women may benefit from higher intake of Vitamin D.  If you are among these women, your doctor will have told you during your visit.   ° °PAP SMEARS:  Pap smears, to check for cervical cancer or precancers,  have traditionally been done yearly, although recent scientific advances have shown that most women can have pap smears less often.  However, every woman still should have a physical exam from her gynecologist every year. It will include a breast check, inspection of the vulva and vagina to check for abnormal growths or skin changes, a visual exam of the cervix, and then an exam to evaluate the size and shape of the uterus and  ovaries.  And after 71 years of age, a rectal exam is indicated to check for rectal cancers. We will also provide age appropriate advice regarding health maintenance, like when you should have certain vaccines, screening for sexually transmitted diseases, bone density testing, colonoscopy, mammograms, etc.  ° °MAMMOGRAMS:  All women over 40 years old should have a yearly mammogram. Many facilities now offer a "3D" mammogram, which may cost around $50 extra out of pocket. If possible,  we recommend you accept the option to have the 3D mammogram performed.  It both reduces the number of women who will be called back for extra views which then turn out to be normal, and it is better than the routine mammogram at detecting truly abnormal areas.   ° °COLONOSCOPY:  Colonoscopy to screen for colon cancer is recommended for all women at age 50.  We know, you hate the idea of the prep.  We agree, BUT, having colon cancer and not knowing it is worse!!  Colon cancer so often starts as a polyp that can be seen and removed at colonscopy, which can quite literally save your life!  And if your first colonoscopy is normal and you have no family history of colon cancer, most women don't have to have it again for 10 years.  Once every ten years, you can do something that may end up saving your life, right?  We will be happy to help you get it scheduled when you are ready.    Be sure to check your insurance coverage so you understand how much it will cost.  It may be covered as a preventative service at no cost, but you should check your particular policy.   ° ° ° °Atrophic Vaginitis °Atrophic vaginitis is a condition in which the tissues that line the vagina become dry and thin. This condition occurs in women who have stopped having their period. It is caused by a drop in a female hormone (estrogen). This hormone helps: °· To keep the vagina moist. °· To make a clear fluid. This clear fluid helps: °? To make the vagina ready for  sex. °? To protect the vagina from infection. °If the lining of the vagina is dry and thin, it may cause irritation, burning, or itchiness. It may also: °· Make sex painful. °· Make an exam of your vagina painful. °· Cause bleeding. °· Make you lose interest in sex. °· Cause a burning feeling when you pee (urinate). °· Cause a brown or yellow fluid to come from your vagina. °Some women do not have symptoms. °Follow these instructions at home: °Medicines °· Take over-the-counter and prescription medicines only as told by your doctor. °· Do not use herbs or other medicines unless your doctor says it is okay. °· Use medicines for for dryness. These include: °? Oils to make the vagina soft. °? Creams. °? Moisturizers. °General instructions °· Do not douche. °· Do not use products that can make your vagina dry. These include: °? Scented sprays. °? Scented tampons. °? Scented soaps. °· Sex can help increase blood flow and soften the tissue in the vagina. If it hurts to have sex: °? Tell your partner. °? Use products to make sex more comfortable. Use these only as told by your doctor. °Contact a doctor if you: °· Have discharge from the vagina that is different than usual. °· Have a bad smell coming from your vagina. °· Have new symptoms. °· Do not get better. °· Get worse. °Summary °· Atrophic vaginitis is a condition in which the lining of the vagina becomes dry and thin. °· This condition affects women who have stopped having their periods. °· Treatment may include using products that help make the vagina soft. °· Call a doctor if do not get better with treatment. °This information is not intended to replace advice given to you by your health care provider. Make sure you discuss any questions you have with your health care provider. °Document Released: 12/30/2007 Document Revised: 07/26/2017 Document Reviewed: 07/26/2017 °Elsevier Patient Education © 2020 [employer] ° °

## 2019-04-08 LAB — VAGINITIS/VAGINOSIS, DNA PROBE
Candida Species: NEGATIVE
Gardnerella vaginalis: NEGATIVE
Trichomonas vaginosis: NEGATIVE

## 2019-04-11 DIAGNOSIS — R69 Illness, unspecified: Secondary | ICD-10-CM | POA: Diagnosis not present

## 2019-04-11 LAB — CYTOLOGY - PAP
Diagnosis: NEGATIVE
HPV: NOT DETECTED

## 2019-04-24 DIAGNOSIS — R69 Illness, unspecified: Secondary | ICD-10-CM | POA: Diagnosis not present

## 2019-05-04 DIAGNOSIS — R69 Illness, unspecified: Secondary | ICD-10-CM | POA: Diagnosis not present

## 2019-05-23 DIAGNOSIS — H2513 Age-related nuclear cataract, bilateral: Secondary | ICD-10-CM | POA: Diagnosis not present

## 2019-05-23 DIAGNOSIS — H43813 Vitreous degeneration, bilateral: Secondary | ICD-10-CM | POA: Diagnosis not present

## 2019-05-23 DIAGNOSIS — H25013 Cortical age-related cataract, bilateral: Secondary | ICD-10-CM | POA: Diagnosis not present

## 2019-05-23 DIAGNOSIS — H524 Presbyopia: Secondary | ICD-10-CM | POA: Diagnosis not present

## 2019-05-25 DIAGNOSIS — Z20828 Contact with and (suspected) exposure to other viral communicable diseases: Secondary | ICD-10-CM | POA: Diagnosis not present

## 2019-05-30 IMAGING — MG DIGITAL SCREENING BILATERAL MAMMOGRAM WITH TOMO AND CAD
8 series · 9 of 24 positions shown · non-contrast
Comparison: Previous exam(s).

CLINICAL DATA: Screening.

EXAM:
DIGITAL SCREENING BILATERAL MAMMOGRAM WITH TOMO AND CAD

[L MLO synth-2D]
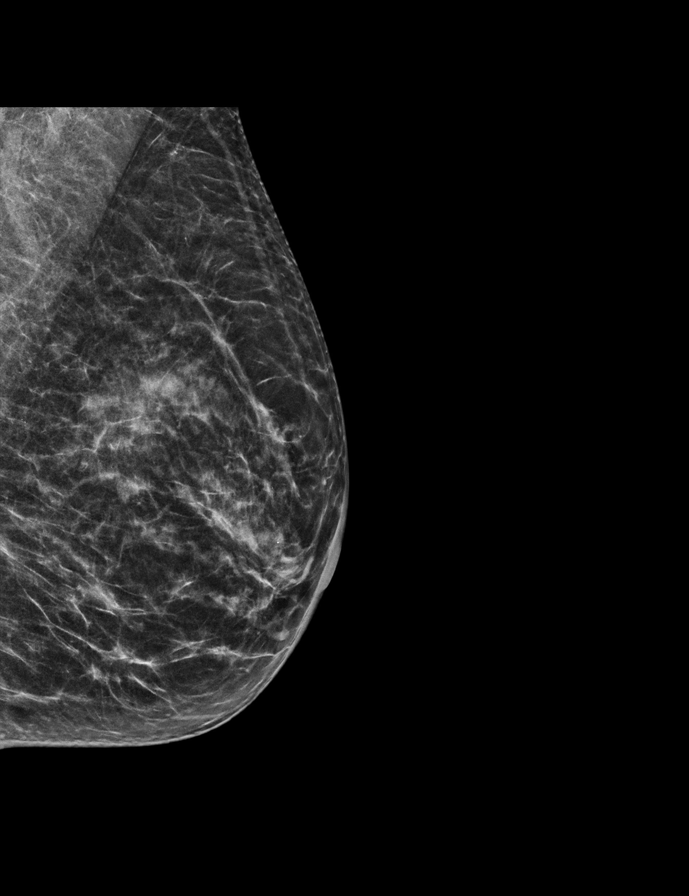

[R CC synth-2D]
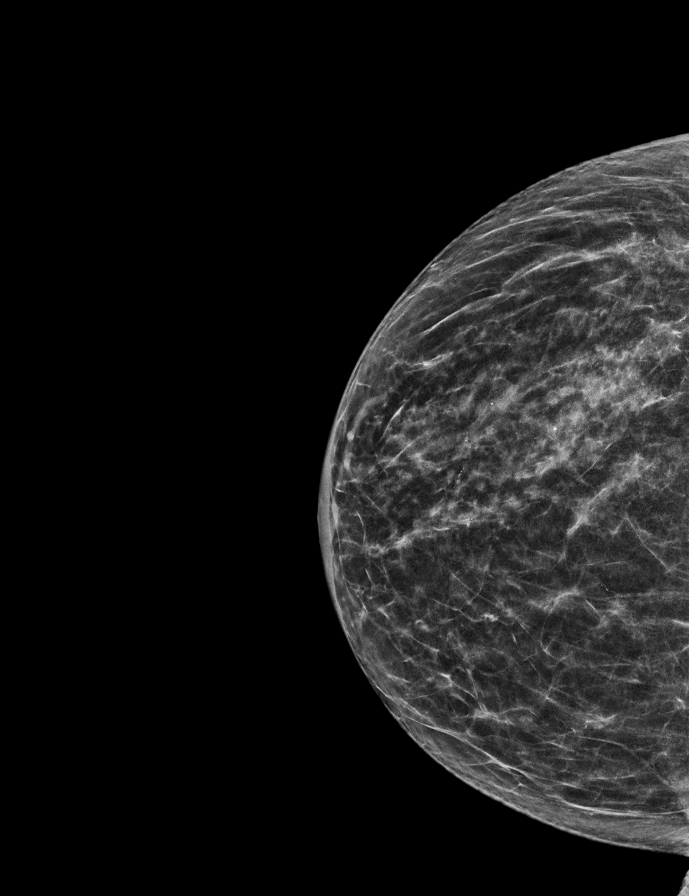

[L CC synth-2D]
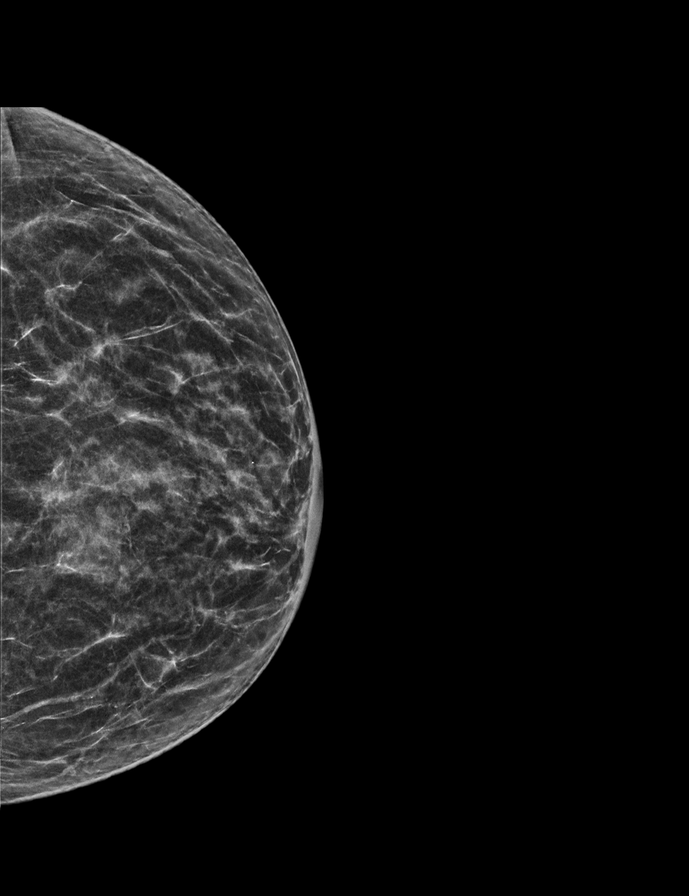

[R MLO synth-2D]
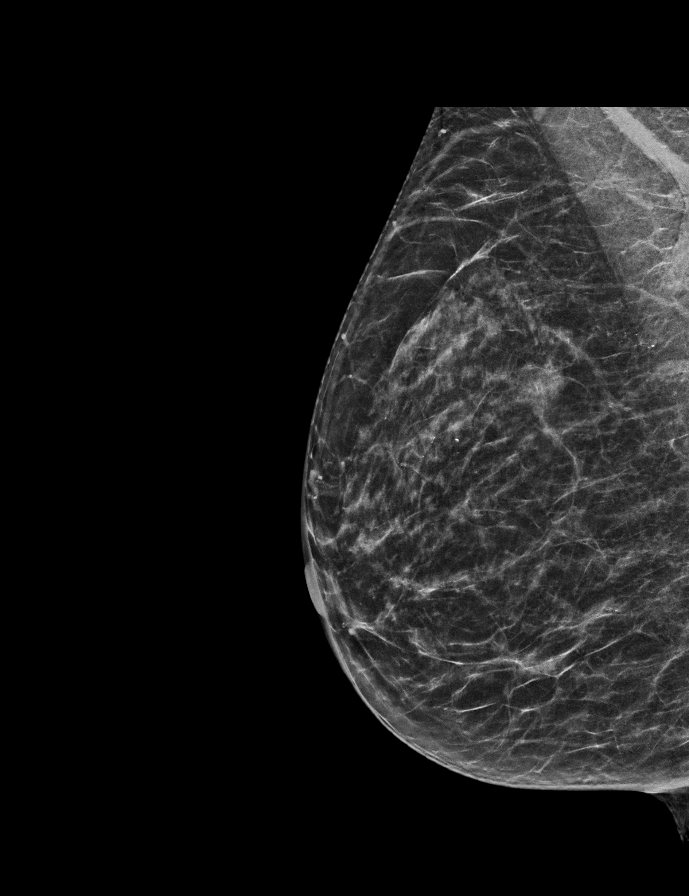

[R MLO tomo · 2 of 54 frames shown]
[frame 18/54]
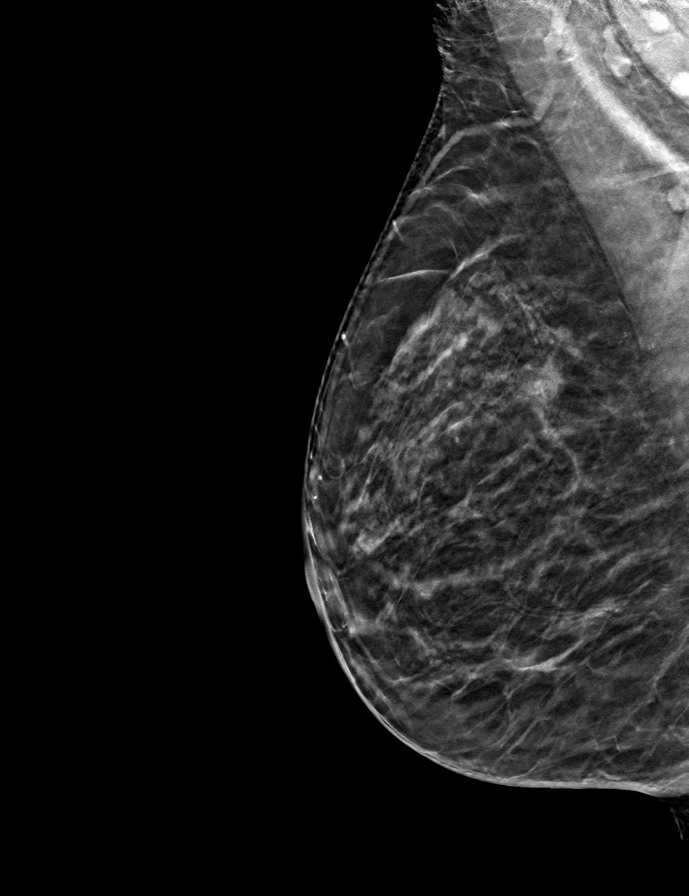
[frame 27/54]
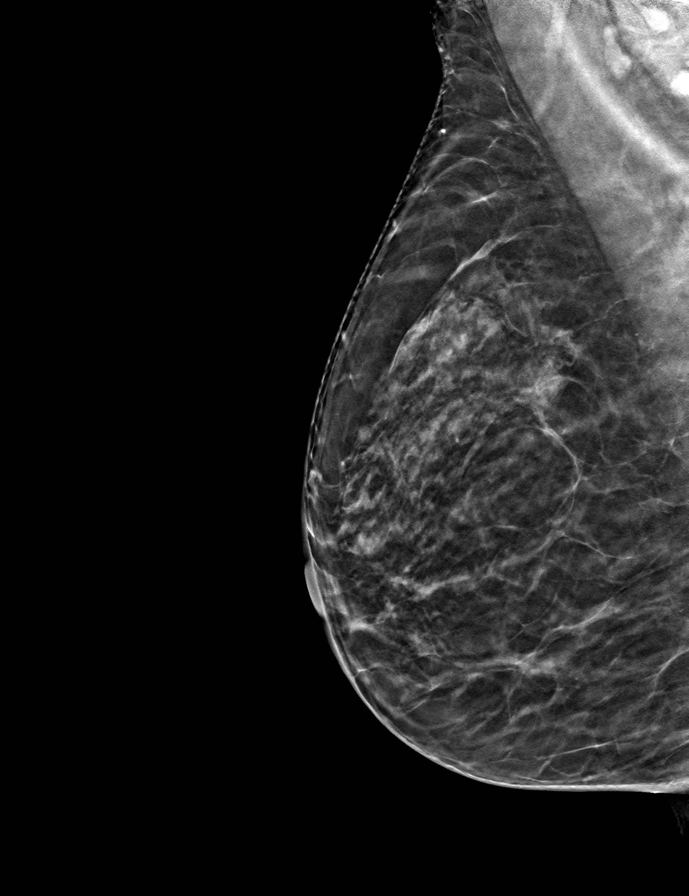

[L CC tomo · tomo slice 27/52.0]
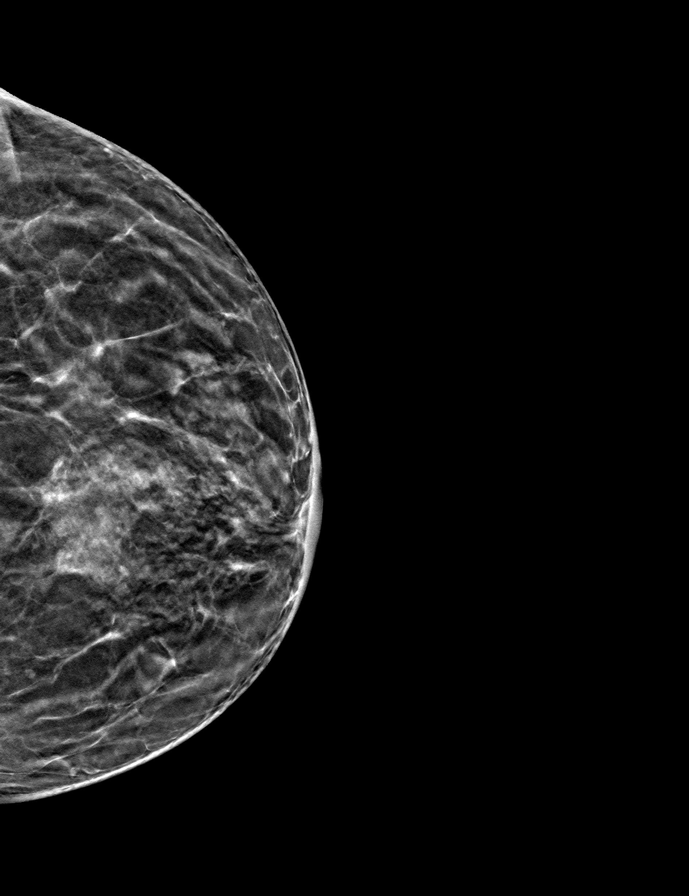

[L MLO tomo · tomo slice 27/53.0]
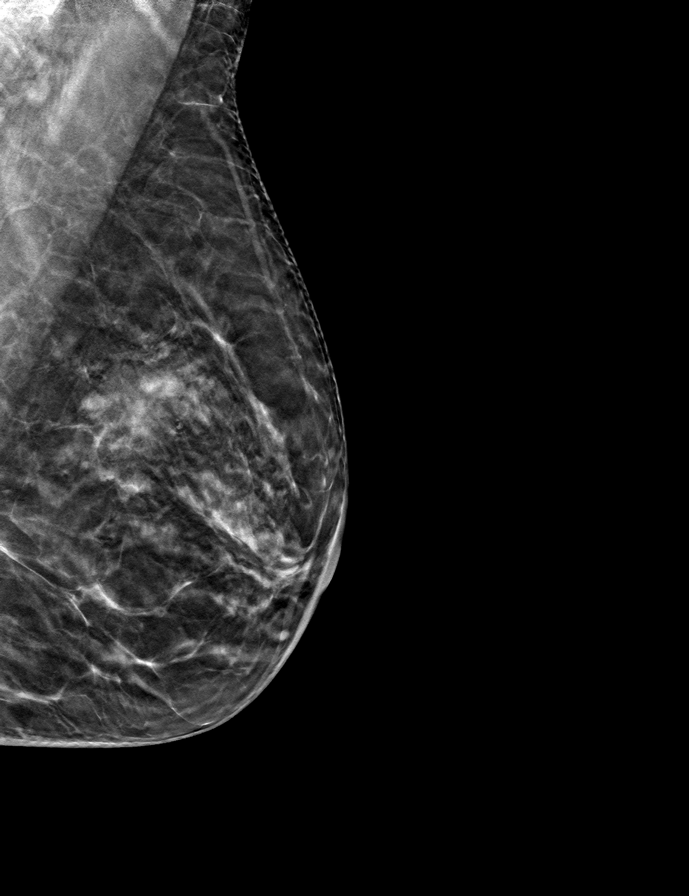

[R CC tomo · tomo slice 27/53.0]
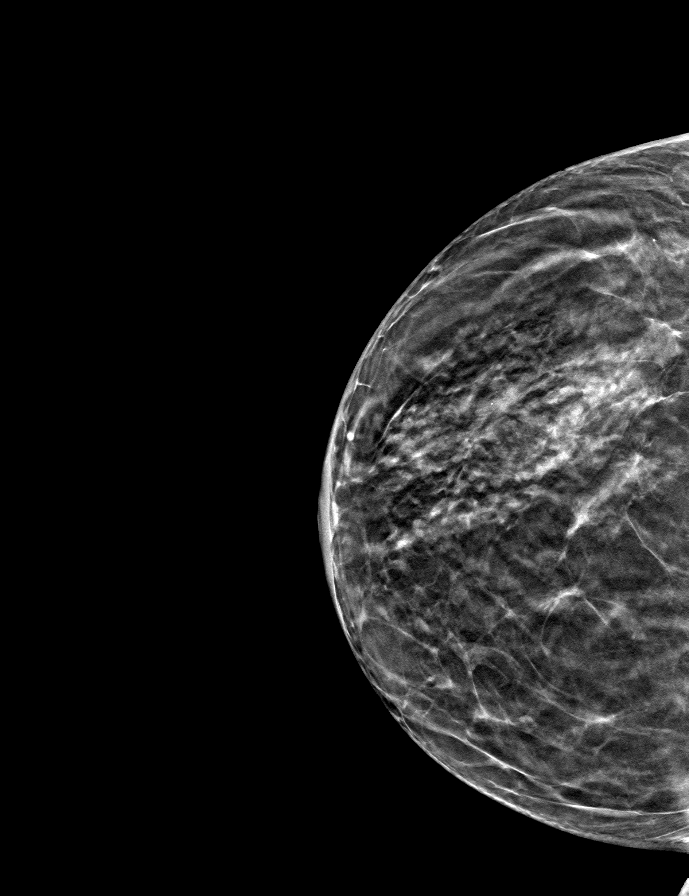

[9 of 24 positions shown; findings below may reference images not displayed]

ACR Breast Density Category c: The breast tissue is heterogeneously
dense, which may obscure small masses.
FINDINGS: In the right breast, calcifications warrant further evaluation with
magnified views. In the left breast, no findings suspicious for
malignancy. Images were processed with CAD.
IMPRESSION: Further evaluation is suggested for calcifications in the right
breast.

RECOMMENDATION:
Diagnostic mammogram of the right breast. (Code:[id number])

The patient will be contacted regarding the findings, and additional
imaging will be scheduled.

BI-RADS CATEGORY  0: Incomplete. Need additional imaging evaluation
and/or prior mammograms for comparison.

## 2019-05-31 DIAGNOSIS — R69 Illness, unspecified: Secondary | ICD-10-CM | POA: Diagnosis not present

## 2019-06-12 DIAGNOSIS — H353211 Exudative age-related macular degeneration, right eye, with active choroidal neovascularization: Secondary | ICD-10-CM | POA: Diagnosis not present

## 2019-06-12 DIAGNOSIS — H353122 Nonexudative age-related macular degeneration, left eye, intermediate dry stage: Secondary | ICD-10-CM | POA: Diagnosis not present

## 2019-06-12 DIAGNOSIS — H43391 Other vitreous opacities, right eye: Secondary | ICD-10-CM | POA: Diagnosis not present

## 2019-06-12 DIAGNOSIS — H43813 Vitreous degeneration, bilateral: Secondary | ICD-10-CM | POA: Diagnosis not present

## 2019-06-26 DIAGNOSIS — E7849 Other hyperlipidemia: Secondary | ICD-10-CM | POA: Diagnosis not present

## 2019-06-26 DIAGNOSIS — R7301 Impaired fasting glucose: Secondary | ICD-10-CM | POA: Diagnosis not present

## 2019-06-26 DIAGNOSIS — M859 Disorder of bone density and structure, unspecified: Secondary | ICD-10-CM | POA: Diagnosis not present

## 2019-06-30 DIAGNOSIS — M542 Cervicalgia: Secondary | ICD-10-CM | POA: Diagnosis not present

## 2019-06-30 DIAGNOSIS — R82998 Other abnormal findings in urine: Secondary | ICD-10-CM | POA: Diagnosis not present

## 2019-06-30 DIAGNOSIS — R7301 Impaired fasting glucose: Secondary | ICD-10-CM | POA: Diagnosis not present

## 2019-06-30 DIAGNOSIS — Z1331 Encounter for screening for depression: Secondary | ICD-10-CM | POA: Diagnosis not present

## 2019-06-30 DIAGNOSIS — M8580 Other specified disorders of bone density and structure, unspecified site: Secondary | ICD-10-CM | POA: Diagnosis not present

## 2019-06-30 DIAGNOSIS — H353210 Exudative age-related macular degeneration, right eye, stage unspecified: Secondary | ICD-10-CM | POA: Diagnosis not present

## 2019-06-30 DIAGNOSIS — E785 Hyperlipidemia, unspecified: Secondary | ICD-10-CM | POA: Diagnosis not present

## 2019-06-30 DIAGNOSIS — I2510 Atherosclerotic heart disease of native coronary artery without angina pectoris: Secondary | ICD-10-CM | POA: Diagnosis not present

## 2019-06-30 DIAGNOSIS — Z800 Family history of malignant neoplasm of digestive organs: Secondary | ICD-10-CM | POA: Diagnosis not present

## 2019-06-30 DIAGNOSIS — Z0000 Encounter for general adult medical examination without abnormal findings: Secondary | ICD-10-CM | POA: Diagnosis not present

## 2019-07-05 ENCOUNTER — Other Ambulatory Visit: Payer: Self-pay | Admitting: Internal Medicine

## 2019-07-05 DIAGNOSIS — Z1231 Encounter for screening mammogram for malignant neoplasm of breast: Secondary | ICD-10-CM

## 2019-07-05 DIAGNOSIS — M8580 Other specified disorders of bone density and structure, unspecified site: Secondary | ICD-10-CM

## 2019-07-12 DIAGNOSIS — Z1212 Encounter for screening for malignant neoplasm of rectum: Secondary | ICD-10-CM | POA: Diagnosis not present

## 2019-07-24 DIAGNOSIS — H353211 Exudative age-related macular degeneration, right eye, with active choroidal neovascularization: Secondary | ICD-10-CM | POA: Diagnosis not present

## 2019-07-24 DIAGNOSIS — H353122 Nonexudative age-related macular degeneration, left eye, intermediate dry stage: Secondary | ICD-10-CM | POA: Diagnosis not present

## 2019-07-25 DIAGNOSIS — Z1212 Encounter for screening for malignant neoplasm of rectum: Secondary | ICD-10-CM | POA: Diagnosis not present

## 2019-07-25 DIAGNOSIS — Z1211 Encounter for screening for malignant neoplasm of colon: Secondary | ICD-10-CM | POA: Diagnosis not present

## 2019-07-25 LAB — COLOGUARD: Cologuard: NEGATIVE

## 2019-09-03 ENCOUNTER — Ambulatory Visit: Payer: Medicare HMO

## 2019-09-04 DIAGNOSIS — H353211 Exudative age-related macular degeneration, right eye, with active choroidal neovascularization: Secondary | ICD-10-CM | POA: Diagnosis not present

## 2019-09-04 DIAGNOSIS — H353122 Nonexudative age-related macular degeneration, left eye, intermediate dry stage: Secondary | ICD-10-CM | POA: Diagnosis not present

## 2019-09-18 ENCOUNTER — Ambulatory Visit: Payer: Medicare HMO

## 2019-09-28 ENCOUNTER — Ambulatory Visit
Admission: RE | Admit: 2019-09-28 | Discharge: 2019-09-28 | Disposition: A | Discharge status: Home / Self Care | Payer: Medicare HMO | Source: Ambulatory Visit | Attending: Internal Medicine | Admitting: Internal Medicine

## 2019-09-28 ENCOUNTER — Other Ambulatory Visit: Payer: Self-pay

## 2019-09-28 DIAGNOSIS — Z1231 Encounter for screening mammogram for malignant neoplasm of breast: Secondary | ICD-10-CM

## 2019-09-28 DIAGNOSIS — M8580 Other specified disorders of bone density and structure, unspecified site: Secondary | ICD-10-CM

## 2019-09-28 DIAGNOSIS — M85852 Other specified disorders of bone density and structure, left thigh: Secondary | ICD-10-CM | POA: Diagnosis not present

## 2019-09-28 DIAGNOSIS — Z780 Asymptomatic menopausal state: Secondary | ICD-10-CM | POA: Diagnosis not present

## 2019-09-29 NOTE — Progress Notes (Signed)
Mammogram reviewed negative. Density C repeat yearly

## 2019-10-02 DIAGNOSIS — H43391 Other vitreous opacities, right eye: Secondary | ICD-10-CM | POA: Diagnosis not present

## 2019-10-02 DIAGNOSIS — H43813 Vitreous degeneration, bilateral: Secondary | ICD-10-CM | POA: Diagnosis not present

## 2019-10-02 DIAGNOSIS — H353211 Exudative age-related macular degeneration, right eye, with active choroidal neovascularization: Secondary | ICD-10-CM | POA: Diagnosis not present

## 2019-10-02 DIAGNOSIS — H353122 Nonexudative age-related macular degeneration, left eye, intermediate dry stage: Secondary | ICD-10-CM | POA: Diagnosis not present

## 2019-10-17 ENCOUNTER — Encounter: Payer: Self-pay | Admitting: Certified Nurse Midwife

## 2019-10-30 DIAGNOSIS — R69 Illness, unspecified: Secondary | ICD-10-CM | POA: Diagnosis not present

## 2019-10-31 DIAGNOSIS — H353211 Exudative age-related macular degeneration, right eye, with active choroidal neovascularization: Secondary | ICD-10-CM | POA: Diagnosis not present

## 2019-10-31 DIAGNOSIS — H353122 Nonexudative age-related macular degeneration, left eye, intermediate dry stage: Secondary | ICD-10-CM | POA: Diagnosis not present

## 2019-11-06 DIAGNOSIS — D2262 Melanocytic nevi of left upper limb, including shoulder: Secondary | ICD-10-CM | POA: Diagnosis not present

## 2019-11-06 DIAGNOSIS — H2513 Age-related nuclear cataract, bilateral: Secondary | ICD-10-CM | POA: Diagnosis not present

## 2019-11-06 DIAGNOSIS — H25013 Cortical age-related cataract, bilateral: Secondary | ICD-10-CM | POA: Diagnosis not present

## 2019-11-06 DIAGNOSIS — H43813 Vitreous degeneration, bilateral: Secondary | ICD-10-CM | POA: Diagnosis not present

## 2019-11-06 DIAGNOSIS — H524 Presbyopia: Secondary | ICD-10-CM | POA: Diagnosis not present

## 2019-11-06 DIAGNOSIS — L821 Other seborrheic keratosis: Secondary | ICD-10-CM | POA: Diagnosis not present

## 2019-11-06 DIAGNOSIS — D224 Melanocytic nevi of scalp and neck: Secondary | ICD-10-CM | POA: Diagnosis not present

## 2019-11-06 DIAGNOSIS — L812 Freckles: Secondary | ICD-10-CM | POA: Diagnosis not present

## 2019-11-06 DIAGNOSIS — L820 Inflamed seborrheic keratosis: Secondary | ICD-10-CM | POA: Diagnosis not present

## 2019-11-06 DIAGNOSIS — D2271 Melanocytic nevi of right lower limb, including hip: Secondary | ICD-10-CM | POA: Diagnosis not present

## 2019-11-20 DIAGNOSIS — R69 Illness, unspecified: Secondary | ICD-10-CM | POA: Diagnosis not present

## 2019-11-23 DIAGNOSIS — H2512 Age-related nuclear cataract, left eye: Secondary | ICD-10-CM | POA: Diagnosis not present

## 2019-11-23 DIAGNOSIS — H25012 Cortical age-related cataract, left eye: Secondary | ICD-10-CM | POA: Diagnosis not present

## 2019-11-23 DIAGNOSIS — H5212 Myopia, left eye: Secondary | ICD-10-CM | POA: Diagnosis not present

## 2019-11-28 DIAGNOSIS — H353122 Nonexudative age-related macular degeneration, left eye, intermediate dry stage: Secondary | ICD-10-CM | POA: Diagnosis not present

## 2019-11-28 DIAGNOSIS — H353211 Exudative age-related macular degeneration, right eye, with active choroidal neovascularization: Secondary | ICD-10-CM | POA: Diagnosis not present

## 2019-12-04 ENCOUNTER — Telehealth: Payer: Self-pay | Admitting: Obstetrics & Gynecology

## 2019-12-04 NOTE — Progress Notes (Signed)
GYNECOLOGY  VISIT  CC:   Vaginal bleeding  HPI: 74 y.o. G2P2002 Married White or Caucasian female here for complaint of bright red bleeding that she noticed on Saturday.  She has not seen any other bleeding.  She has been using coconut oil prior to that so wasn't sure if she tore the skin.  She has noticed more urinary leakage when walking.  She does leak with sneezing but not much with coughing.  She does have some occasional urinary pressure.  She denies pelvic pain and dysuria.  Does have some low back pain.  Denies fever.  Last Pap was 03/2019 neg with neg HR HPV.    GYNECOLOGIC HISTORY: Patient's last menstrual period was 07/27/2001. Contraception: post menopausal Menopausal hormone therapy: none  Patient Active Problem List   Diagnosis Date Noted  . Pure hypercholesterolemia 06/06/2014    Class: History of    Past Medical History:  Diagnosis Date  . Anemia   . Elevated cholesterol   . Macular degeneration   . STD (sexually transmitted disease)    HSV1    Past Surgical History:  Procedure Laterality Date  . BACK SURGERY    . BREAST BIOPSY Right 2020  . CATARACT EXTRACTION Left   . COSMETIC SURGERY     eyes & under chin    MEDS:   Current Outpatient Medications on File Prior to Visit  Medication Sig Dispense Refill  . Cholecalciferol (VITAMIN D) 2000 UNITS tablet Take 2,000 Units by mouth daily.     Marland Kitchen moxifloxacin (VIGAMOX) 0.5 % ophthalmic solution     . Multiple Vitamins-Minerals (PRESERVISION AREDS 2 PO) Take by mouth.    . prednisoLONE acetate (PRED FORTE) 1 % ophthalmic suspension     . PROLENSA 0.07 % SOLN     . Propylene Glycol (SYSTANE BALANCE OP) Apply to eye.    . rosuvastatin (CRESTOR) 10 MG tablet     . valACYclovir (VALTREX) 1000 MG tablet Take one tablet twice daily x one day only at onset 30 tablet 2   No current facility-administered medications on file prior to visit.    ALLERGIES: Betadine [povidone iodine], Penicillins, and Sulfa  antibiotics  Family History  Problem Relation Age of Onset  . Cancer Father        colon  . Hypertension Father   . Stroke Father     SH:  Married, non smoker  Review of Systems  Constitutional: Negative.   HENT: Negative.   Eyes: Negative.   Respiratory: Negative.   Cardiovascular: Negative.   Gastrointestinal: Negative.   Endocrine: Negative.   Genitourinary:       Post menopausal bleeding, increased urine leakage, occasional pressure  Musculoskeletal: Negative.   Skin: Negative.   Allergic/Immunologic: Negative.   Neurological: Negative.   Psychiatric/Behavioral: Negative.     PHYSICAL EXAMINATION:    BP 120/72   Pulse 68   Temp 97.9 F (36.6 C) (Skin)   Resp 16   Wt 124 lb (56.2 kg)   LMP 07/27/2001   BMI 22.14 kg/m     General appearance: alert, cooperative and appears stated age Abdomen: soft, non-tender; bowel sounds normal; no masses,  no organomegaly Lymph:  no inguinal LAD noted  Pelvic: External genitalia:  no lesions              Urethra:  normal appearing urethra with no masses, tenderness or lesions              Bartholins and Skenes: normal  Vagina: atrophic changes, no discharge, no lesions              Cervix: no lesions              Bimanual Exam:  Uterus:  normal size, contour, position, consistency, mobility, non-tender              Adnexa: no mass, fullness, tenderness            Chaperone, Terence Lux, CMA, was present for exam.  Assessment: Single episode of PMP bleeding Vaginal atrophy  Plan: Urine micro obtained to ensure bleeding not from bladder D/w pt endometrial biopsy vs PUS and assessing endometrium.  She is aware proceeding with ultrasound first could still means she needs and ultrasound.  She is ok with this.  Will return later today for ultrasound.  Remainder of documentation will be on that encounter.

## 2019-12-04 NOTE — Telephone Encounter (Signed)
Patient had some spotting over the weekend and not sure if she needs an appointments.

## 2019-12-04 NOTE — Telephone Encounter (Signed)
AEX 03/2019 with DL  H/o atrophic vaginitis  H/o HSV 1 on Valtrex Rx  PMP no HRT  Neg Vaginitis test on 03/2019 Last Pap normal and neg 03/2019  Spoke with patient. Pt reports having spotting of bright red bleeding that occurred x 1 on Sunday 5/9. Pt states happened with wiping after voiding. Pt denies vaginal discharge, odor, itching, fever, chills, back pain and UTI sx. Pt denies any bleeding today or wearing panty liner or pad.  Pt states having some pressure and has noticed an increase in urinary leaking when walking in the past 2 months. Denies with sneezing or coughing.  Pt advised to be seen for further evaluation. Pt agreeable. Pt scheduled for OV with Dr Sabra Heck on 5/11 at 8 am. Pt verbalized understanding. CPS Neg.   Routing to Dr Sabra Heck for review.  Encounter closed.

## 2019-12-05 ENCOUNTER — Ambulatory Visit (INDEPENDENT_AMBULATORY_CARE_PROVIDER_SITE_OTHER): Payer: Medicare HMO | Admitting: Obstetrics & Gynecology

## 2019-12-05 ENCOUNTER — Encounter: Payer: Self-pay | Admitting: Obstetrics & Gynecology

## 2019-12-05 ENCOUNTER — Ambulatory Visit (INDEPENDENT_AMBULATORY_CARE_PROVIDER_SITE_OTHER): Payer: Medicare HMO

## 2019-12-05 ENCOUNTER — Other Ambulatory Visit: Payer: Self-pay

## 2019-12-05 ENCOUNTER — Ambulatory Visit: Payer: Medicare HMO | Admitting: Obstetrics & Gynecology

## 2019-12-05 VITALS — BP 120/72 | HR 68 | Temp 97.9°F | Resp 16 | Wt 124.0 lb

## 2019-12-05 DIAGNOSIS — R32 Unspecified urinary incontinence: Secondary | ICD-10-CM

## 2019-12-05 DIAGNOSIS — N859 Noninflammatory disorder of uterus, unspecified: Secondary | ICD-10-CM

## 2019-12-05 DIAGNOSIS — N950 Postmenopausal bleeding: Secondary | ICD-10-CM | POA: Diagnosis not present

## 2019-12-05 DIAGNOSIS — D251 Intramural leiomyoma of uterus: Secondary | ICD-10-CM | POA: Diagnosis not present

## 2019-12-06 ENCOUNTER — Encounter: Payer: Self-pay | Admitting: Obstetrics & Gynecology

## 2019-12-06 DIAGNOSIS — D251 Intramural leiomyoma of uterus: Secondary | ICD-10-CM | POA: Insufficient documentation

## 2019-12-06 LAB — URINALYSIS, MICROSCOPIC ONLY
Bacteria, UA: NONE SEEN
Casts: NONE SEEN /lpf
Epithelial Cells (non renal): NONE SEEN /hpf (ref 0–10)
RBC: NONE SEEN /hpf (ref 0–2)
WBC, UA: NONE SEEN /hpf (ref 0–5)

## 2019-12-06 NOTE — Progress Notes (Signed)
74 y.o. G86P2002 Married White or Caucasian female here for pelvic ultrasound due to episode of PMP bleeding and need for endometrial evaluation.  Pt seen earlier in the day and that note has been closed.  No charges placed at this point.  Patient's last menstrual period was 07/27/2001.  Contraception: PMP  Findings:  UTERUS: 5.9x 4.1 x 2.9cm with three fibroids, calcified measuring 1.7cm, 30mm and 57mm EMS: 1.4 - 2.8mm, sliver of fluid noted ADNEXA: Left ovary: 1.9 x 1.5 x 1.6cm       Right ovary: 2.0 x 1.0 x 0.80mm with 31mm paraovarian cyst, thin walled, avascular  CUL DE SAC: no free fluid Other findings:  Prominent vasculature in left pelvis  Discussion:  Images reviewed.  Findings discussed.  Do not feel pt needs endometrial biopsy at this point.  Prior PUS 2007 without fibroids.  Reviewed in [place].  Given this was still in her 63's, feel repeat PUS in 4-6 months is prudent.    Assessment:  Single episode of PMP bleeding Uterine fibroids, not noted on PUS in 2007 Paraovarian cyst, right, 44mm Prominent left pelvic vasculature Sliver of endometrial fluid  Plan:  Repeat PUS in 4-6 months Pt aware to call if has any further bleeding.  Would obtain biopsy at that time Urine micro negative  About 30 minutes total spent with pt and in review of prior results.  This includes visit from earlier in day that is documented separately.

## 2019-12-07 DIAGNOSIS — H25011 Cortical age-related cataract, right eye: Secondary | ICD-10-CM | POA: Diagnosis not present

## 2019-12-07 DIAGNOSIS — H25811 Combined forms of age-related cataract, right eye: Secondary | ICD-10-CM | POA: Diagnosis not present

## 2019-12-07 DIAGNOSIS — H2511 Age-related nuclear cataract, right eye: Secondary | ICD-10-CM | POA: Diagnosis not present

## 2019-12-07 LAB — URINE CULTURE

## 2019-12-18 DIAGNOSIS — D485 Neoplasm of uncertain behavior of skin: Secondary | ICD-10-CM | POA: Diagnosis not present

## 2019-12-18 DIAGNOSIS — B078 Other viral warts: Secondary | ICD-10-CM | POA: Diagnosis not present

## 2020-01-02 DIAGNOSIS — H353211 Exudative age-related macular degeneration, right eye, with active choroidal neovascularization: Secondary | ICD-10-CM | POA: Diagnosis not present

## 2020-01-02 DIAGNOSIS — H43813 Vitreous degeneration, bilateral: Secondary | ICD-10-CM | POA: Diagnosis not present

## 2020-01-02 DIAGNOSIS — H353123 Nonexudative age-related macular degeneration, left eye, advanced atrophic without subfoveal involvement: Secondary | ICD-10-CM | POA: Diagnosis not present

## 2020-01-16 DIAGNOSIS — H353211 Exudative age-related macular degeneration, right eye, with active choroidal neovascularization: Secondary | ICD-10-CM | POA: Diagnosis not present

## 2020-01-16 DIAGNOSIS — H353123 Nonexudative age-related macular degeneration, left eye, advanced atrophic without subfoveal involvement: Secondary | ICD-10-CM | POA: Diagnosis not present

## 2020-02-13 DIAGNOSIS — H353211 Exudative age-related macular degeneration, right eye, with active choroidal neovascularization: Secondary | ICD-10-CM | POA: Diagnosis not present

## 2020-02-13 DIAGNOSIS — H353123 Nonexudative age-related macular degeneration, left eye, advanced atrophic without subfoveal involvement: Secondary | ICD-10-CM | POA: Diagnosis not present

## 2020-03-18 DIAGNOSIS — H353211 Exudative age-related macular degeneration, right eye, with active choroidal neovascularization: Secondary | ICD-10-CM | POA: Diagnosis not present

## 2020-03-18 DIAGNOSIS — H353123 Nonexudative age-related macular degeneration, left eye, advanced atrophic without subfoveal involvement: Secondary | ICD-10-CM | POA: Diagnosis not present

## 2020-03-18 DIAGNOSIS — H43813 Vitreous degeneration, bilateral: Secondary | ICD-10-CM | POA: Diagnosis not present

## 2020-04-03 DIAGNOSIS — R030 Elevated blood-pressure reading, without diagnosis of hypertension: Secondary | ICD-10-CM | POA: Diagnosis not present

## 2020-04-03 DIAGNOSIS — M542 Cervicalgia: Secondary | ICD-10-CM | POA: Diagnosis not present

## 2020-04-05 NOTE — Progress Notes (Signed)
74 y.o. G2P2002 Married White or Caucasian female here for breast and pelvic exam.  I am also following her for episode of PMP bleeding that occurred in May, 2021.  Ultrasound was done then showing thin endometrium with a little sliver of fluid, three small fibroids and a paraovarian cyst.  She's had no vaginal bleeding since that time.  She did have a breast biopsy in 2020.  She had a normal MMG in March.  She also gets prescriptions for HSV 1 (oral) from [place].  Does not need this right now.  Patient's last menstrual period was 07/27/2001.          Sexually active: No.  H/O STD:  no  Health Maintenance: PCP:  Marton Redwood.  Last wellness appt was virtual last year.  Did blood work at that appt: yes.  Has appt scheduled in Nov/Dec time frame.   Vaccines are up to date:  To check with pcp Colonoscopy:  2011 f/u 7yrs, per MD f/u in 53yrs.  She did a cologuard last year.  She did this with Dr. Brigitte Pulse.   MMG:  09-29-2019 category c density birads 1:neg BMD:  09-28-2019 Last pap smear:  03-19-16 neg, 04-07-2019 neg  H/o abnormal pap smear:  no    reports that she has quit smoking. She has never used smokeless tobacco. She reports current alcohol use of about 4.0 standard drinks of alcohol per week. She reports that she does not use drugs.  Past Medical History:  Diagnosis Date  . Anemia   . Elevated cholesterol   . Macular degeneration   . STD (sexually transmitted disease)    HSV1    Past Surgical History:  Procedure Laterality Date  . BACK SURGERY    . BREAST BIOPSY Right 2020  . CATARACT EXTRACTION Left   . COSMETIC SURGERY     eyes & under chin    Current Outpatient Medications  Medication Sig Dispense Refill  . Cholecalciferol (VITAMIN D) 2000 UNITS tablet Take 2,000 Units by mouth daily.     . Multiple Vitamins-Minerals (PRESERVISION AREDS 2 PO) Take by mouth.    . Propylene Glycol (SYSTANE BALANCE OP) Apply to eye.    . rosuvastatin (CRESTOR) 10 MG tablet     . tobramycin (TOBREX)  0.3 % ophthalmic solution     . valACYclovir (VALTREX) 1000 MG tablet Take one tablet twice daily x one day only at onset 30 tablet 2   No current facility-administered medications for this visit.    Family History  Problem Relation Age of Onset  . Cancer Father        colon  . Hypertension Father   . Stroke Father     Review of Systems  Constitutional: Negative.   HENT: Negative.   Eyes: Negative.   Respiratory: Negative.   Cardiovascular: Negative.   Gastrointestinal: Negative.   Endocrine: Negative.   Genitourinary: Negative.   Musculoskeletal: Negative.   Skin: Negative.   Allergic/Immunologic: Negative.   Neurological: Negative.   Hematological: Negative.   Psychiatric/Behavioral: Negative.     Exam:   BP 112/70   Pulse 68   Resp 16   Ht 5' 2.75" (1.594 m)   Wt 123 lb (55.8 kg)   LMP 07/27/2001   BMI 21.96 kg/m   Height: 5' 2.75" (159.4 cm)  General appearance: alert, cooperative and appears stated age Breasts: normal appearance, no masses or tenderness Abdomen: soft, non-tender; bowel sounds normal; no masses,  no organomegaly Lymph nodes: Cervical, supraclavicular, and  axillary nodes normal.  No abnormal inguinal nodes palpated Neurologic: Grossly normal  Pelvic: External genitalia:  no lesions              Urethra:  normal appearing urethra with no masses, tenderness or lesions              Bartholins and Skenes: normal                 Vagina: normal appearing vagina with normal color and discharge, no lesions              Cervix: no lesions              Pap taken: No. Bimanual Exam:  Uterus:  normal size, contour, position, consistency, mobility, non-tender              Adnexa: normal adnexa and no mass, fullness, tenderness               Rectovaginal: Confirms               Anus:  normal sphincter tone, no lesions  Chaperone, Royal Hawthorn, CMA, was present for exam.  A:  Breast and Pelvic exam PMP, no HRT Atrophic vaginal changes H/o HSV1 H/o  breast biopsy H/o PMP bleeding 11/2019, endometrium 2.74mm on ultrasound  P:   Mammogram guidelines reviewed pap smear neg 2020 Does not need rx for Valtrex.  She will call if she does Pt knows to call with any new vaginal bleeding.  Would obtain biopsy at that time.   Return 2 years for breast and pelvic exam or sooner with any new issue/concern.  In addition to breast and pelvic exam, about 20 minutes spent discussing PMP bleeding, additional evaluation for future bleeding, HSV 1 outbreaks

## 2020-04-08 ENCOUNTER — Other Ambulatory Visit: Payer: Self-pay

## 2020-04-08 ENCOUNTER — Ambulatory Visit: Payer: Medicare HMO | Admitting: Certified Nurse Midwife

## 2020-04-08 ENCOUNTER — Ambulatory Visit (INDEPENDENT_AMBULATORY_CARE_PROVIDER_SITE_OTHER): Payer: Medicare HMO | Admitting: Obstetrics & Gynecology

## 2020-04-08 ENCOUNTER — Encounter: Payer: Self-pay | Admitting: Obstetrics & Gynecology

## 2020-04-08 VITALS — BP 112/70 | HR 68 | Resp 16 | Ht 62.75 in | Wt 123.0 lb

## 2020-04-08 DIAGNOSIS — Z01419 Encounter for gynecological examination (general) (routine) without abnormal findings: Secondary | ICD-10-CM

## 2020-04-08 DIAGNOSIS — N952 Postmenopausal atrophic vaginitis: Secondary | ICD-10-CM | POA: Diagnosis not present

## 2020-04-08 DIAGNOSIS — Z8619 Personal history of other infectious and parasitic diseases: Secondary | ICD-10-CM | POA: Diagnosis not present

## 2020-04-08 DIAGNOSIS — N950 Postmenopausal bleeding: Secondary | ICD-10-CM | POA: Diagnosis not present

## 2020-04-15 DIAGNOSIS — H353123 Nonexudative age-related macular degeneration, left eye, advanced atrophic without subfoveal involvement: Secondary | ICD-10-CM | POA: Diagnosis not present

## 2020-04-15 DIAGNOSIS — H353211 Exudative age-related macular degeneration, right eye, with active choroidal neovascularization: Secondary | ICD-10-CM | POA: Diagnosis not present

## 2020-04-29 DIAGNOSIS — H353211 Exudative age-related macular degeneration, right eye, with active choroidal neovascularization: Secondary | ICD-10-CM | POA: Diagnosis not present

## 2020-05-06 ENCOUNTER — Ambulatory Visit: Payer: Medicare HMO | Admitting: Obstetrics & Gynecology

## 2020-05-06 DIAGNOSIS — R69 Illness, unspecified: Secondary | ICD-10-CM | POA: Diagnosis not present

## 2020-05-07 DIAGNOSIS — Z1211 Encounter for screening for malignant neoplasm of colon: Secondary | ICD-10-CM

## 2020-05-07 DIAGNOSIS — Z1212 Encounter for screening for malignant neoplasm of rectum: Secondary | ICD-10-CM

## 2020-05-27 DIAGNOSIS — H353123 Nonexudative age-related macular degeneration, left eye, advanced atrophic without subfoveal involvement: Secondary | ICD-10-CM | POA: Diagnosis not present

## 2020-05-27 DIAGNOSIS — H43813 Vitreous degeneration, bilateral: Secondary | ICD-10-CM | POA: Diagnosis not present

## 2020-05-27 DIAGNOSIS — H353211 Exudative age-related macular degeneration, right eye, with active choroidal neovascularization: Secondary | ICD-10-CM | POA: Diagnosis not present

## 2020-05-28 DIAGNOSIS — R69 Illness, unspecified: Secondary | ICD-10-CM | POA: Diagnosis not present

## 2020-06-06 ENCOUNTER — Other Ambulatory Visit (HOSPITAL_COMMUNITY): Payer: Self-pay | Admitting: Internal Medicine

## 2020-06-06 ENCOUNTER — Other Ambulatory Visit: Payer: Self-pay | Admitting: Internal Medicine

## 2020-06-06 DIAGNOSIS — R1030 Lower abdominal pain, unspecified: Secondary | ICD-10-CM

## 2020-06-06 DIAGNOSIS — D72829 Elevated white blood cell count, unspecified: Secondary | ICD-10-CM | POA: Diagnosis not present

## 2020-06-07 ENCOUNTER — Other Ambulatory Visit: Payer: Self-pay

## 2020-06-07 ENCOUNTER — Ambulatory Visit (HOSPITAL_COMMUNITY)
Admission: RE | Admit: 2020-06-07 | Discharge: 2020-06-07 | Disposition: A | Discharge status: Home / Self Care | Payer: Medicare HMO | Source: Ambulatory Visit | Attending: Internal Medicine | Admitting: Internal Medicine

## 2020-06-07 DIAGNOSIS — R1030 Lower abdominal pain, unspecified: Secondary | ICD-10-CM | POA: Diagnosis present

## 2020-06-07 DIAGNOSIS — R109 Unspecified abdominal pain: Secondary | ICD-10-CM | POA: Diagnosis not present

## 2020-06-07 MED ORDER — IOHEXOL 300 MG/ML  SOLN
100.0000 mL | Freq: Once | INTRAMUSCULAR | Status: AC | PRN
  Administered 2020-06-07: 100 mL via INTRAVENOUS

## 2020-06-28 DIAGNOSIS — R7301 Impaired fasting glucose: Secondary | ICD-10-CM | POA: Diagnosis not present

## 2020-06-28 DIAGNOSIS — M859 Disorder of bone density and structure, unspecified: Secondary | ICD-10-CM | POA: Diagnosis not present

## 2020-06-28 DIAGNOSIS — E785 Hyperlipidemia, unspecified: Secondary | ICD-10-CM | POA: Diagnosis not present

## 2020-07-05 DIAGNOSIS — Z800 Family history of malignant neoplasm of digestive organs: Secondary | ICD-10-CM | POA: Diagnosis not present

## 2020-07-05 DIAGNOSIS — E785 Hyperlipidemia, unspecified: Secondary | ICD-10-CM | POA: Diagnosis not present

## 2020-07-05 DIAGNOSIS — Z0000 Encounter for general adult medical examination without abnormal findings: Secondary | ICD-10-CM | POA: Diagnosis not present

## 2020-07-05 DIAGNOSIS — Z1331 Encounter for screening for depression: Secondary | ICD-10-CM | POA: Diagnosis not present

## 2020-07-05 DIAGNOSIS — I2510 Atherosclerotic heart disease of native coronary artery without angina pectoris: Secondary | ICD-10-CM | POA: Diagnosis not present

## 2020-07-05 DIAGNOSIS — H353210 Exudative age-related macular degeneration, right eye, stage unspecified: Secondary | ICD-10-CM | POA: Diagnosis not present

## 2020-07-05 DIAGNOSIS — R7301 Impaired fasting glucose: Secondary | ICD-10-CM | POA: Diagnosis not present

## 2020-07-05 DIAGNOSIS — M8580 Other specified disorders of bone density and structure, unspecified site: Secondary | ICD-10-CM | POA: Diagnosis not present

## 2020-07-05 DIAGNOSIS — B009 Herpesviral infection, unspecified: Secondary | ICD-10-CM | POA: Diagnosis not present

## 2020-07-05 DIAGNOSIS — Z1212 Encounter for screening for malignant neoplasm of rectum: Secondary | ICD-10-CM | POA: Diagnosis not present

## 2020-07-05 DIAGNOSIS — R82998 Other abnormal findings in urine: Secondary | ICD-10-CM | POA: Diagnosis not present

## 2020-07-05 DIAGNOSIS — Z1339 Encounter for screening examination for other mental health and behavioral disorders: Secondary | ICD-10-CM | POA: Diagnosis not present

## 2020-07-23 DIAGNOSIS — H353123 Nonexudative age-related macular degeneration, left eye, advanced atrophic without subfoveal involvement: Secondary | ICD-10-CM | POA: Diagnosis not present

## 2020-07-23 DIAGNOSIS — H353211 Exudative age-related macular degeneration, right eye, with active choroidal neovascularization: Secondary | ICD-10-CM | POA: Diagnosis not present

## 2020-08-29 ENCOUNTER — Other Ambulatory Visit: Payer: Self-pay | Admitting: Internal Medicine

## 2020-08-29 DIAGNOSIS — Z1231 Encounter for screening mammogram for malignant neoplasm of breast: Secondary | ICD-10-CM

## 2020-09-17 DIAGNOSIS — H353123 Nonexudative age-related macular degeneration, left eye, advanced atrophic without subfoveal involvement: Secondary | ICD-10-CM | POA: Diagnosis not present

## 2020-09-17 DIAGNOSIS — H353211 Exudative age-related macular degeneration, right eye, with active choroidal neovascularization: Secondary | ICD-10-CM | POA: Diagnosis not present

## 2020-10-03 DIAGNOSIS — E785 Hyperlipidemia, unspecified: Secondary | ICD-10-CM | POA: Diagnosis not present

## 2020-10-17 ENCOUNTER — Ambulatory Visit
Admission: RE | Admit: 2020-10-17 | Discharge: 2020-10-17 | Disposition: A | Discharge status: Home / Self Care | Payer: Medicare HMO | Source: Ambulatory Visit | Attending: Internal Medicine | Admitting: Internal Medicine

## 2020-10-17 ENCOUNTER — Other Ambulatory Visit: Payer: Self-pay

## 2020-10-17 DIAGNOSIS — Z1231 Encounter for screening mammogram for malignant neoplasm of breast: Secondary | ICD-10-CM

## 2020-10-21 ENCOUNTER — Other Ambulatory Visit: Payer: Self-pay | Admitting: Internal Medicine

## 2020-10-21 DIAGNOSIS — R928 Other abnormal and inconclusive findings on diagnostic imaging of breast: Secondary | ICD-10-CM

## 2020-10-28 DIAGNOSIS — H43813 Vitreous degeneration, bilateral: Secondary | ICD-10-CM | POA: Diagnosis not present

## 2020-10-28 DIAGNOSIS — H353211 Exudative age-related macular degeneration, right eye, with active choroidal neovascularization: Secondary | ICD-10-CM | POA: Diagnosis not present

## 2020-10-28 DIAGNOSIS — H353123 Nonexudative age-related macular degeneration, left eye, advanced atrophic without subfoveal involvement: Secondary | ICD-10-CM | POA: Diagnosis not present

## 2020-11-06 ENCOUNTER — Ambulatory Visit
Admission: RE | Admit: 2020-11-06 | Discharge: 2020-11-06 | Disposition: A | Discharge status: Home / Self Care | Payer: Medicare HMO | Source: Ambulatory Visit | Attending: Internal Medicine | Admitting: Internal Medicine

## 2020-11-06 ENCOUNTER — Other Ambulatory Visit: Payer: Self-pay

## 2020-11-06 ENCOUNTER — Ambulatory Visit: Payer: Medicare HMO

## 2020-11-06 DIAGNOSIS — R928 Other abnormal and inconclusive findings on diagnostic imaging of breast: Secondary | ICD-10-CM

## 2020-11-06 DIAGNOSIS — R922 Inconclusive mammogram: Secondary | ICD-10-CM | POA: Diagnosis not present

## 2020-11-15 DIAGNOSIS — L821 Other seborrheic keratosis: Secondary | ICD-10-CM | POA: Diagnosis not present

## 2020-11-15 DIAGNOSIS — D1801 Hemangioma of skin and subcutaneous tissue: Secondary | ICD-10-CM | POA: Diagnosis not present

## 2020-11-15 DIAGNOSIS — D2239 Melanocytic nevi of other parts of face: Secondary | ICD-10-CM | POA: Diagnosis not present

## 2020-11-15 DIAGNOSIS — D225 Melanocytic nevi of trunk: Secondary | ICD-10-CM | POA: Diagnosis not present

## 2020-11-19 ENCOUNTER — Encounter: Payer: Self-pay | Admitting: Internal Medicine

## 2020-11-25 DIAGNOSIS — H353123 Nonexudative age-related macular degeneration, left eye, advanced atrophic without subfoveal involvement: Secondary | ICD-10-CM | POA: Diagnosis not present

## 2020-11-25 DIAGNOSIS — H353211 Exudative age-related macular degeneration, right eye, with active choroidal neovascularization: Secondary | ICD-10-CM | POA: Diagnosis not present

## 2020-12-24 DIAGNOSIS — H353211 Exudative age-related macular degeneration, right eye, with active choroidal neovascularization: Secondary | ICD-10-CM | POA: Diagnosis not present

## 2020-12-24 DIAGNOSIS — H353123 Nonexudative age-related macular degeneration, left eye, advanced atrophic without subfoveal involvement: Secondary | ICD-10-CM | POA: Diagnosis not present

## 2021-01-15 DIAGNOSIS — H04123 Dry eye syndrome of bilateral lacrimal glands: Secondary | ICD-10-CM | POA: Diagnosis not present

## 2021-01-15 DIAGNOSIS — H353211 Exudative age-related macular degeneration, right eye, with active choroidal neovascularization: Secondary | ICD-10-CM | POA: Diagnosis not present

## 2021-01-15 DIAGNOSIS — H5213 Myopia, bilateral: Secondary | ICD-10-CM | POA: Diagnosis not present

## 2021-01-15 DIAGNOSIS — Z961 Presence of intraocular lens: Secondary | ICD-10-CM | POA: Diagnosis not present

## 2021-01-24 DIAGNOSIS — H353123 Nonexudative age-related macular degeneration, left eye, advanced atrophic without subfoveal involvement: Secondary | ICD-10-CM | POA: Diagnosis not present

## 2021-01-24 DIAGNOSIS — H353211 Exudative age-related macular degeneration, right eye, with active choroidal neovascularization: Secondary | ICD-10-CM | POA: Diagnosis not present

## 2021-02-12 DIAGNOSIS — Z961 Presence of intraocular lens: Secondary | ICD-10-CM | POA: Diagnosis not present

## 2021-03-11 DIAGNOSIS — H353211 Exudative age-related macular degeneration, right eye, with active choroidal neovascularization: Secondary | ICD-10-CM | POA: Diagnosis not present

## 2021-03-11 DIAGNOSIS — H353123 Nonexudative age-related macular degeneration, left eye, advanced atrophic without subfoveal involvement: Secondary | ICD-10-CM | POA: Diagnosis not present

## 2021-03-11 DIAGNOSIS — H43813 Vitreous degeneration, bilateral: Secondary | ICD-10-CM | POA: Diagnosis not present

## 2021-04-21 DIAGNOSIS — H43813 Vitreous degeneration, bilateral: Secondary | ICD-10-CM | POA: Diagnosis not present

## 2021-04-21 DIAGNOSIS — H353211 Exudative age-related macular degeneration, right eye, with active choroidal neovascularization: Secondary | ICD-10-CM | POA: Diagnosis not present

## 2021-04-21 DIAGNOSIS — H353123 Nonexudative age-related macular degeneration, left eye, advanced atrophic without subfoveal involvement: Secondary | ICD-10-CM | POA: Diagnosis not present

## 2021-04-22 ENCOUNTER — Ambulatory Visit (HOSPITAL_BASED_OUTPATIENT_CLINIC_OR_DEPARTMENT_OTHER): Payer: Medicare HMO | Admitting: Obstetrics & Gynecology

## 2021-04-23 ENCOUNTER — Ambulatory Visit (INDEPENDENT_AMBULATORY_CARE_PROVIDER_SITE_OTHER): Payer: Medicare HMO | Admitting: Obstetrics & Gynecology

## 2021-04-23 ENCOUNTER — Encounter (HOSPITAL_BASED_OUTPATIENT_CLINIC_OR_DEPARTMENT_OTHER): Payer: Self-pay | Admitting: Obstetrics & Gynecology

## 2021-04-23 ENCOUNTER — Other Ambulatory Visit: Payer: Self-pay

## 2021-04-23 VITALS — BP 158/81 | HR 59 | Ht 63.0 in | Wt 124.0 lb

## 2021-04-23 DIAGNOSIS — R030 Elevated blood-pressure reading, without diagnosis of hypertension: Secondary | ICD-10-CM

## 2021-04-23 DIAGNOSIS — B009 Herpesviral infection, unspecified: Secondary | ICD-10-CM | POA: Diagnosis not present

## 2021-04-23 DIAGNOSIS — R931 Abnormal findings on diagnostic imaging of heart and coronary circulation: Secondary | ICD-10-CM | POA: Diagnosis not present

## 2021-04-23 DIAGNOSIS — Z780 Asymptomatic menopausal state: Secondary | ICD-10-CM | POA: Diagnosis not present

## 2021-04-23 DIAGNOSIS — Z9189 Other specified personal risk factors, not elsewhere classified: Secondary | ICD-10-CM

## 2021-04-23 DIAGNOSIS — D251 Intramural leiomyoma of uterus: Secondary | ICD-10-CM

## 2021-04-23 DIAGNOSIS — Z23 Encounter for immunization: Secondary | ICD-10-CM | POA: Diagnosis not present

## 2021-04-23 DIAGNOSIS — E785 Hyperlipidemia, unspecified: Secondary | ICD-10-CM

## 2021-04-23 DIAGNOSIS — Z1159 Encounter for screening for other viral diseases: Secondary | ICD-10-CM

## 2021-04-23 MED ORDER — ROSUVASTATIN CALCIUM 10 MG PO TABS: 10.0000 mg | ORAL_TABLET | Freq: Every day | ORAL | Status: DC

## 2021-04-23 NOTE — Progress Notes (Signed)
75 y.o. G6P2002 Married White or Caucasian female here for breast and pelvic exam.  Doing well.  Had episode of bleeding in 11/2019.  Ultrasound with thin endometrium.  No bleeding since that time.  Did have diverticulitis last fall.  Had CT scan.  Was treated and followed by Dr. Brigitte Pulse.    H/o HSV1.  She's used valacyclovir for outbreaks.  Does not need refill.   Planning on retiring Feb 28th.     Patient's last menstrual period was 07/27/2001.          Sexually active: Yes.    H/o HSV 1  Health Maintenance: PCP:  Dr. Brigitte Pulse.  Last wellness appt was last winter.  Did blood work at that appt:  yes Vaccines are up to date:  yes except flu shots Colonoscopy:  cologuard was negative in 2020.  Going to proceed with colonoscopy this year.  Has gotten reminder MMG:  10/17/2020 Needed additional imaging BMD:  09/28/2019 Last pap smear:  04/07/2019 Negative.   H/o abnormal pap smear:  no   reports that she has quit smoking. She has never used smokeless tobacco. She reports current alcohol use of about 4.0 standard drinks per week. She reports that she does not use drugs.  Past Medical History:  Diagnosis Date   Anemia    Elevated cholesterol    Macular degeneration    STD (sexually transmitted disease)    HSV1    Past Surgical History:  Procedure Laterality Date   BACK SURGERY     BREAST BIOPSY Right 2020   CATARACT EXTRACTION Left    COSMETIC SURGERY     eyes & under chin    Current Outpatient Medications  Medication Sig Dispense Refill   atorvastatin (LIPITOR) 10 MG tablet Take 10 mg by mouth daily.     Cholecalciferol (VITAMIN D) 2000 UNITS tablet Take 2,000 Units by mouth daily.      ezetimibe (ZETIA) 10 MG tablet Take 10 mg by mouth daily.     Multiple Vitamins-Minerals (PRESERVISION AREDS 2 PO) Take by mouth.     Propylene Glycol (SYSTANE BALANCE OP) Apply to eye.     tobramycin (TOBREX) 0.3 % ophthalmic solution      valACYclovir (VALTREX) 1000 MG tablet Take one tablet twice  daily x one day only at onset 30 tablet 2   rosuvastatin (CRESTOR) 10 MG tablet  (Patient not taking: Reported on 04/23/2021)     No current facility-administered medications for this visit.    Family History  Problem Relation Age of Onset   Cancer Father        colon   Hypertension Father    Stroke Father     Review of Systems  All other systems reviewed and are negative.  Exam:   BP (!) 158/81 (BP Location: Left Arm, Patient Position: Sitting, Cuff Size: Small)   Pulse (!) 59   Ht 5\' 3"  (1.6 m)   Wt 124 lb (56.2 kg)   LMP 07/27/2001   BMI 21.97 kg/m   Height: 5\' 3"  (160 cm)  General appearance: alert, cooperative and appears stated age Breasts: normal appearance, no masses or tenderness Abdomen: soft, non-tender; bowel sounds normal; no masses,  no organomegaly Lymph nodes: Cervical, supraclavicular, and axillary nodes normal.  No abnormal inguinal nodes palpated Neurologic: Grossly normal  Pelvic: External genitalia:  no lesions              Urethra:  normal appearing urethra with no masses, tenderness or lesions  Bartholins and Skenes: normal                 Vagina: normal appearing vagina with atrophic changes and no discharge, no lesions              Cervix: no lesions              Pap taken: No. Bimanual Exam:  Uterus:  normal size, contour, position, consistency, mobility, non-tender              Adnexa: normal adnexa and no mass, fullness, tenderness               Rectovaginal: Confirms               Anus:  normal sphincter tone, no lesions  Chaperone, Octaviano Batty, CMA, was present for exam.  Assessment/Plan: 1. GYN exam for high-risk Medicare patient - MMG up to date - BMD done 2021 with mild osteopenia noted - colonoscopy due.   - care gaps reviewed.  Hep C and HIV ordered today. - Flu vaccine HIGH DOSE PF (Fluzone High Dose)  2. HSV infection - does not need valtrex refill  3. Postmenopausal  4. Intramural uterine fibroid - no follow  up recommended  5. Elevated blood pressure reading - pt will monitor  6. Elevated lipids - followed by Dr. Brigitte Pulse

## 2021-04-24 ENCOUNTER — Encounter: Payer: Self-pay | Admitting: Internal Medicine

## 2021-04-24 LAB — HEPATITIS C ANTIBODY: Hep C Virus Ab: <0.1 s/co ratio (ref 0.0–0.9)

## 2021-04-24 LAB — HIV ANTIBODY (ROUTINE TESTING W REFLEX): HIV Screen 4th Generation wRfx: NONREACTIVE

## 2021-05-21 ENCOUNTER — Encounter: Payer: Self-pay | Admitting: Internal Medicine

## 2021-05-21 ENCOUNTER — Ambulatory Visit (AMBULATORY_SURGERY_CENTER): Payer: Medicare HMO | Admitting: *Deleted

## 2021-05-21 ENCOUNTER — Other Ambulatory Visit: Payer: Self-pay

## 2021-05-21 VITALS — Ht 63.25 in | Wt 123.0 lb

## 2021-05-21 DIAGNOSIS — Z1211 Encounter for screening for malignant neoplasm of colon: Secondary | ICD-10-CM

## 2021-05-21 NOTE — Progress Notes (Signed)
No egg or soy allergy known to patient  No issues known to pt with past sedation with any surgeries or procedures Patient denies ever being told they had issues or difficulty with intubation  No FH of Malignant Hyperthermia Pt is not on diet pills Pt is not on  home 02  Pt is not on blood thinners  Pt denies issues with constipation  No A fib or A flutter  Pt is fully vaccinated  for Covid   Due to the COVID-19 pandemic we are asking patients to follow certain guidelines in PV and the [facility]   Pt aware of COVID protocols and [facility] guidelines   Pt verified name, DOB, address and insurance during PV today.  Pt mailed instruction packet of Emmi video, copy of consent form to read and not return, and instructions.  PV completed over the phone.  Pt encouraged to call with questions or issues.  My Chart instructions to pt as well

## 2021-06-02 DIAGNOSIS — H353123 Nonexudative age-related macular degeneration, left eye, advanced atrophic without subfoveal involvement: Secondary | ICD-10-CM | POA: Diagnosis not present

## 2021-06-02 DIAGNOSIS — H353211 Exudative age-related macular degeneration, right eye, with active choroidal neovascularization: Secondary | ICD-10-CM | POA: Diagnosis not present

## 2021-06-10 ENCOUNTER — Other Ambulatory Visit: Payer: Self-pay

## 2021-06-10 ENCOUNTER — Ambulatory Visit (AMBULATORY_SURGERY_CENTER): Payer: Medicare HMO | Admitting: Internal Medicine

## 2021-06-10 ENCOUNTER — Encounter: Payer: Self-pay | Admitting: Internal Medicine

## 2021-06-10 VITALS — BP 119/55 | HR 59 | Temp 97.5°F | Resp 14 | Ht 63.0 in | Wt 121.0 lb

## 2021-06-10 DIAGNOSIS — K635 Polyp of colon: Secondary | ICD-10-CM | POA: Diagnosis not present

## 2021-06-10 DIAGNOSIS — D122 Benign neoplasm of ascending colon: Secondary | ICD-10-CM | POA: Diagnosis not present

## 2021-06-10 DIAGNOSIS — Z1211 Encounter for screening for malignant neoplasm of colon: Secondary | ICD-10-CM | POA: Diagnosis not present

## 2021-06-10 MED ORDER — SODIUM CHLORIDE 0.9 % IV SOLN: 500.0000 mL | Freq: Once | INTRAVENOUS | Status: DC

## 2021-06-10 NOTE — Patient Instructions (Addendum)
I found and removed one tiny polyp. I will have it analyzed and let you know what it was. I do not think you will need another routine colonoscopy.  I appreciate the opportunity to care for you. Gatha Mayer, MD, [facility]   Discharge instructions given. Handouts on polyps and diverticulosis. Resume previous medications. YOU HAD AN ENDOSCOPIC PROCEDURE TODAY AT [facility]:   Refer to the procedure report that was given to you for any specific questions about what was found during the examination.  If the procedure report does not answer your questions, please call your gastroenterologist to clarify.  If you requested that your care partner not be given the details of your procedure findings, then the procedure report has been included in a sealed envelope for you to review at your convenience later.  YOU SHOULD EXPECT: Some feelings of bloating in the abdomen. Passage of more gas than usual.  Walking can help get rid of the air that was put into your GI tract during the procedure and reduce the bloating. If you had a lower endoscopy (such as a colonoscopy or flexible sigmoidoscopy) you may notice spotting of blood in your stool or on the toilet paper. If you underwent a bowel prep for your procedure, you may not have a normal bowel movement for a few days.  Please Note:  You might notice some irritation and congestion in your nose or some drainage.  This is from the oxygen used during your procedure.  There is no need for concern and it should clear up in a day or so.  SYMPTOMS TO REPORT IMMEDIATELY:  Following lower endoscopy (colonoscopy or flexible sigmoidoscopy):  Excessive amounts of blood in the stool  Significant tenderness or worsening of abdominal pains  Swelling of the abdomen that is new, acute  Fever of 100F or higher  For urgent or emergent issues, a gastroenterologist can be reached at any hour by calling [phone]. Do not use MyChart messaging for urgent  concerns.    DIET:  We do recommend a small meal at first, but then you may proceed to your regular diet.  Drink plenty of fluids but you should avoid alcoholic beverages for 24 hours.  ACTIVITY:  You should plan to take it easy for the rest of today and you should NOT DRIVE or use heavy machinery until tomorrow (because of the sedation medicines used during the test).    FOLLOW UP: Our staff will call the number listed on your records 48-72 hours following your procedure to check on you and address any questions or concerns that you may have regarding the information given to you following your procedure. If we do not reach you, we will leave a message.  We will attempt to reach you two times.  During this call, we will ask if you have developed any symptoms of COVID 19. If you develop any symptoms (ie: fever, flu-like symptoms, shortness of breath, cough etc.) before then, please call [phone].  If you test positive for Covid 19 in the 2 weeks post procedure, please call and report this information to [place].    If any biopsies were taken you will be contacted by phone or by letter within the next 1-3 weeks.  Please call us at [phone] if you have not heard about the biopsies in 3 weeks.    SIGNATURES/CONFIDENTIALITY: You and/or your care partner have signed paperwork which will be entered into your electronic medical record.  These signatures  attest to the fact that that the information above on your After Visit Summary has been reviewed and is understood.  Full responsibility of the confidentiality of this discharge information lies with you and/or your care-partner.

## 2021-06-10 NOTE — Op Note (Signed)
[facility] Patient Name: Yolanda Nicholson Procedure Date: 06/10/2021 11:11 AM MRN: [id number] Endoscopist: Gatha Mayer , MD Age: 75 Referring MD:  Date of Birth: 05/27/1946 Gender: Female Account #: [id number] Procedure:                Colonoscopy Indications:              Screening for colorectal malignant neoplasm Medicines:                Propofol per Anesthesia, Monitored Anesthesia Care Procedure:                Pre-Anesthesia Assessment:                           - Prior to the procedure, a History and Physical                            was performed, and patient medications and                            allergies were reviewed. The patient's tolerance of                            previous anesthesia was also reviewed. The risks                            and benefits of the procedure and the sedation                            options and risks were discussed with the patient.                            All questions were answered, and informed consent                            was obtained. Prior Anticoagulants: The patient has                            taken no previous anticoagulant or antiplatelet                            agents. ASA Grade Assessment: II - A patient with                            mild systemic disease. After reviewing the risks                            and benefits, the patient was deemed in                            satisfactory condition to undergo the procedure.                           After obtaining informed consent, the colonoscope  was passed under direct vision. Throughout the                            procedure, the patient's blood pressure, pulse, and                            oxygen saturations were monitored continuously. The                            PCF-HQ190L Colonoscope was introduced through the                            anus and advanced to the the cecum, identified by                             appendiceal orifice and ileocecal valve. The                            colonoscopy was performed without difficulty. The                            patient tolerated the procedure well. The quality                            of the bowel preparation was good. The ileocecal                            valve, appendiceal orifice, and rectum were                            photographed. Scope In: 11:26:27 AM Scope Out: 11:43:13 AM Scope Withdrawal Time: 0 hours 9 minutes 48 seconds  Total Procedure Duration: 0 hours 16 minutes 46 seconds  Findings:                 The perianal and digital rectal examinations were                            normal.                           A 1 to 2 mm polyp was found in the ascending colon.                            The polyp was sessile. The polyp was removed with a                            cold biopsy forceps. Resection and retrieval were                            complete. Verification of patient identification                            for the specimen was done. Estimated blood loss was  minimal.                           Multiple diverticula were found in the sigmoid                            colon.                           The exam was otherwise without abnormality on                            direct and retroflexion views. Complications:            No immediate complications. Estimated Blood Loss:     Estimated blood loss was minimal. Impression:               - One 1 to 2 mm polyp in the ascending colon,                            removed with a cold biopsy forceps. Resected and                            retrieved.                           - Diverticulosis in the sigmoid colon.                           - The examination was otherwise normal on direct                            and retroflexion views. Recommendation:           - Patient has a contact number available for                             emergencies. The signs and symptoms of potential                            delayed complications were discussed with the                            patient. Return to normal activities tomorrow.                            Written discharge instructions were provided to the                            patient.                           - Resume previous diet.                           - Continue present medications.                           -  Await pathology results.                           - No recommendation at this time regarding repeat                            colonoscopy due to age. Gatha Mayer, MD 06/10/2021 11:51:33 AM This report has been signed electronically.

## 2021-06-10 NOTE — Progress Notes (Signed)
VS-CW  Pt's states no medical or surgical changes since previsit or office visit.  

## 2021-06-10 NOTE — Progress Notes (Signed)
A and O x3. Report to RN. Tolerated MAC anesthesia well. 

## 2021-06-10 NOTE — Progress Notes (Signed)
Called to room to assist during endoscopic procedure.  Patient ID and intended procedure confirmed with present staff. Received instructions for my participation in the procedure from the performing physician.  

## 2021-06-10 NOTE — Progress Notes (Signed)
[facility] History and Physical   Primary Care Physician:  Ginger Organ., MD   Reason for Procedure:   Colon cancer screening  Plan:    colonoscopy     HPI: Yolanda Nicholson is a 75 y.o. female here for screening colonoscopy   Past Medical History:  Diagnosis Date   Anemia    past hx , not current   Cataract    removed bilat with lens implants   Colon polyps    pt thinks she has had polyps removed at some point in the past   Elevated cholesterol    Macular degeneration    Osteopenia    STD (sexually transmitted disease)    HSV1    Past Surgical History:  Procedure Laterality Date   BACK SURGERY     BREAST BIOPSY Right 2020   CATARACT EXTRACTION Bilateral    with lens implants   COLONOSCOPY     COSMETIC SURGERY     eyes & under chin    Prior to Admission medications   Medication Sig Start Date End Date Taking? Authorizing Provider  Cholecalciferol (VITAMIN D) 2000 UNITS tablet Take 2,000 Units by mouth daily.    Yes [provider]  ezetimibe (ZETIA) 10 MG tablet Take 10 mg by mouth daily.   Yes [provider]  Multiple Vitamins-Minerals (PRESERVISION AREDS 2 PO) Take by mouth.   Yes [provider]  rosuvastatin (CRESTOR) 10 MG tablet Take 1 tablet (10 mg total) by mouth daily. 04/23/21  Yes Megan Salon, MD  Propylene Glycol (SYSTANE BALANCE OP) Apply to eye.    [provider]  tobramycin (TOBREX) 0.3 % ophthalmic solution  03/15/20   [provider]  valACYclovir (VALTREX) 1000 MG tablet Take one tablet twice daily x one day only at onset Patient not taking: No sig reported 04/07/19   Regina Eck, CNM    Current Outpatient Medications  Medication Sig Dispense Refill   Cholecalciferol (VITAMIN D) 2000 UNITS tablet Take 2,000 Units by mouth daily.      ezetimibe (ZETIA) 10 MG tablet Take 10 mg by mouth daily.     Multiple Vitamins-Minerals (PRESERVISION AREDS 2 PO) Take by mouth.      rosuvastatin (CRESTOR) 10 MG tablet Take 1 tablet (10 mg total) by mouth daily.     Propylene Glycol (SYSTANE BALANCE OP) Apply to eye.     tobramycin (TOBREX) 0.3 % ophthalmic solution      valACYclovir (VALTREX) 1000 MG tablet Take one tablet twice daily x one day only at onset (Patient not taking: No sig reported) 30 tablet 2   Current Facility-Administered Medications  Medication Dose Route Frequency Provider Last Rate Last Admin   0.9 %  sodium chloride infusion  500 mL Intravenous Once Gatha Mayer, MD        Allergies as of 06/10/2021 - Review Complete 06/10/2021  Allergen Reaction Noted   Betadine [povidone iodine]  12/05/2019   Penicillins Rash 04/29/2012   Sulfa antibiotics Rash 04/29/2012    Family History  Problem Relation Age of Onset   Colon cancer Father    Cancer Father        colon   Hypertension Father    Stroke Father    Colon polyps Neg Hx    Esophageal cancer Neg Hx    Rectal cancer Neg Hx    Stomach cancer Neg Hx     Social History   Socioeconomic History   Marital status:  Married    Spouse name: Not on file   Number of children: Not on file   Years of education: Not on file   Highest education level: Not on file  Occupational History   Not on file  Tobacco Use   Smoking status: Former   Smokeless tobacco: Never   Tobacco comments:    in college  Vaping Use   Vaping Use: Never used  Substance and Sexual Activity   Alcohol use: Yes    Alcohol/week: 4.0 standard drinks    Types: 4 Glasses of wine per week   Drug use: No   Sexual activity: Not Currently    Partners: Male    Birth control/protection: Post-menopausal    Comment: husband vasectomy  Other Topics Concern   Not on file  Social History Narrative   Not on file   Social Determinants of Health   Financial Resource Strain: Not on file  Food Insecurity: Not on file  Transportation Needs: Not on file  Physical Activity: Not on file  Stress: Not on file  Social  Connections: Not on file  Intimate Partner Violence: Not on file    Review of Systems:  All other review of systems negative except as mentioned in the HPI.  Physical Exam: Vital signs BP (!) 146/71   Pulse 70   Temp (!) 97.5 F (36.4 C) (Temporal)   Resp 12   Ht 5\' 3"  (1.6 m)   Wt 121 lb (54.9 kg)   LMP 07/27/2001   SpO2 100%   BMI 21.43 kg/m   General:   Alert,  Well-developed, well-nourished, pleasant and cooperative in NAD Lungs:  Clear throughout to auscultation.   Heart:  Regular rate and rhythm; no murmurs, clicks, rubs,  or gallops. Abdomen:  Soft, nontender and nondistended. Normal bowel sounds.   Neuro/Psych:  Alert and cooperative. Normal mood and affect. A and O x 3   @[employer]  Simonne Maffucci, MD, [facility] [phone] (pager) 06/10/2021 11:19 AM@

## 2021-06-12 ENCOUNTER — Telehealth: Payer: Self-pay | Admitting: *Deleted

## 2021-06-12 NOTE — Telephone Encounter (Signed)
  Follow up Call-  Call back number 06/10/2021  Post procedure Call Back phone  # [phone]  Permission to leave phone message Yes  Some recent data might be hidden     Patient questions:  Do you have a fever, pain , or abdominal swelling? No. Pain Score  0 *  Have you tolerated food without any problems? Yes.    Have you been able to return to your normal activities? Yes.    Do you have any questions about your discharge instructions: Diet   No. Medications  No. Follow up visit  No.  Do you have questions or concerns about your Care? No.  Actions: * If pain score is 4 or above: No action needed, pain <4.  Have you developed a fever since your procedure? no  2.   Have you had an respiratory symptoms (SOB or cough) since your procedure? no  3.   Have you tested positive for COVID 19 since your procedure no  4.   Have you had any family members/close contacts diagnosed with the COVID 19 since your procedure?  no   If yes to any of these questions please route to Joylene John, RN and Joella Prince, RN

## 2021-06-16 ENCOUNTER — Encounter: Payer: Self-pay | Admitting: Internal Medicine

## 2021-07-14 DIAGNOSIS — H353211 Exudative age-related macular degeneration, right eye, with active choroidal neovascularization: Secondary | ICD-10-CM | POA: Diagnosis not present

## 2021-08-12 DIAGNOSIS — M859 Disorder of bone density and structure, unspecified: Secondary | ICD-10-CM | POA: Diagnosis not present

## 2021-08-12 DIAGNOSIS — E785 Hyperlipidemia, unspecified: Secondary | ICD-10-CM | POA: Diagnosis not present

## 2021-08-12 DIAGNOSIS — R7301 Impaired fasting glucose: Secondary | ICD-10-CM | POA: Diagnosis not present

## 2021-08-14 DIAGNOSIS — R82998 Other abnormal findings in urine: Secondary | ICD-10-CM | POA: Diagnosis not present

## 2021-08-14 DIAGNOSIS — H353210 Exudative age-related macular degeneration, right eye, stage unspecified: Secondary | ICD-10-CM | POA: Diagnosis not present

## 2021-08-14 DIAGNOSIS — B009 Herpesviral infection, unspecified: Secondary | ICD-10-CM | POA: Diagnosis not present

## 2021-08-14 DIAGNOSIS — K219 Gastro-esophageal reflux disease without esophagitis: Secondary | ICD-10-CM | POA: Diagnosis not present

## 2021-08-14 DIAGNOSIS — E785 Hyperlipidemia, unspecified: Secondary | ICD-10-CM | POA: Diagnosis not present

## 2021-08-14 DIAGNOSIS — R7301 Impaired fasting glucose: Secondary | ICD-10-CM | POA: Diagnosis not present

## 2021-08-14 DIAGNOSIS — D692 Other nonthrombocytopenic purpura: Secondary | ICD-10-CM | POA: Diagnosis not present

## 2021-08-14 DIAGNOSIS — Z800 Family history of malignant neoplasm of digestive organs: Secondary | ICD-10-CM | POA: Diagnosis not present

## 2021-08-14 DIAGNOSIS — Z1339 Encounter for screening examination for other mental health and behavioral disorders: Secondary | ICD-10-CM | POA: Diagnosis not present

## 2021-08-14 DIAGNOSIS — Z1331 Encounter for screening for depression: Secondary | ICD-10-CM | POA: Diagnosis not present

## 2021-08-14 DIAGNOSIS — I2510 Atherosclerotic heart disease of native coronary artery without angina pectoris: Secondary | ICD-10-CM | POA: Diagnosis not present

## 2021-08-14 DIAGNOSIS — M8580 Other specified disorders of bone density and structure, unspecified site: Secondary | ICD-10-CM | POA: Diagnosis not present

## 2021-08-14 DIAGNOSIS — Z0000 Encounter for general adult medical examination without abnormal findings: Secondary | ICD-10-CM | POA: Diagnosis not present

## 2021-08-25 DIAGNOSIS — H353211 Exudative age-related macular degeneration, right eye, with active choroidal neovascularization: Secondary | ICD-10-CM | POA: Diagnosis not present

## 2021-08-25 DIAGNOSIS — H43813 Vitreous degeneration, bilateral: Secondary | ICD-10-CM | POA: Diagnosis not present

## 2021-08-25 DIAGNOSIS — H353123 Nonexudative age-related macular degeneration, left eye, advanced atrophic without subfoveal involvement: Secondary | ICD-10-CM | POA: Diagnosis not present

## 2021-09-12 ENCOUNTER — Other Ambulatory Visit: Payer: Self-pay | Admitting: Internal Medicine

## 2021-09-12 DIAGNOSIS — Z1231 Encounter for screening mammogram for malignant neoplasm of breast: Secondary | ICD-10-CM

## 2021-10-06 DIAGNOSIS — H353211 Exudative age-related macular degeneration, right eye, with active choroidal neovascularization: Secondary | ICD-10-CM | POA: Diagnosis not present

## 2021-10-06 DIAGNOSIS — H353123 Nonexudative age-related macular degeneration, left eye, advanced atrophic without subfoveal involvement: Secondary | ICD-10-CM | POA: Diagnosis not present

## 2021-10-06 DIAGNOSIS — H43813 Vitreous degeneration, bilateral: Secondary | ICD-10-CM | POA: Diagnosis not present

## 2021-11-03 ENCOUNTER — Ambulatory Visit
Admission: RE | Admit: 2021-11-03 | Discharge: 2021-11-03 | Disposition: A | Discharge status: Home / Self Care | Payer: Medicare HMO | Source: Ambulatory Visit | Attending: Internal Medicine | Admitting: Internal Medicine

## 2021-11-03 DIAGNOSIS — Z1231 Encounter for screening mammogram for malignant neoplasm of breast: Secondary | ICD-10-CM | POA: Diagnosis not present

## 2021-11-27 DIAGNOSIS — R319 Hematuria, unspecified: Secondary | ICD-10-CM | POA: Diagnosis not present

## 2021-11-27 DIAGNOSIS — K5792 Diverticulitis of intestine, part unspecified, without perforation or abscess without bleeding: Secondary | ICD-10-CM | POA: Diagnosis not present

## 2021-12-01 DIAGNOSIS — H353123 Nonexudative age-related macular degeneration, left eye, advanced atrophic without subfoveal involvement: Secondary | ICD-10-CM | POA: Diagnosis not present

## 2021-12-01 DIAGNOSIS — H353211 Exudative age-related macular degeneration, right eye, with active choroidal neovascularization: Secondary | ICD-10-CM | POA: Diagnosis not present

## 2021-12-17 DIAGNOSIS — R82998 Other abnormal findings in urine: Secondary | ICD-10-CM | POA: Diagnosis not present

## 2021-12-19 DIAGNOSIS — L821 Other seborrheic keratosis: Secondary | ICD-10-CM | POA: Diagnosis not present

## 2021-12-19 DIAGNOSIS — D225 Melanocytic nevi of trunk: Secondary | ICD-10-CM | POA: Diagnosis not present

## 2021-12-19 DIAGNOSIS — D224 Melanocytic nevi of scalp and neck: Secondary | ICD-10-CM | POA: Diagnosis not present

## 2021-12-19 DIAGNOSIS — L298 Other pruritus: Secondary | ICD-10-CM | POA: Diagnosis not present

## 2021-12-19 DIAGNOSIS — L218 Other seborrheic dermatitis: Secondary | ICD-10-CM | POA: Diagnosis not present

## 2021-12-19 DIAGNOSIS — D2239 Melanocytic nevi of other parts of face: Secondary | ICD-10-CM | POA: Diagnosis not present

## 2021-12-19 DIAGNOSIS — D2272 Melanocytic nevi of left lower limb, including hip: Secondary | ICD-10-CM | POA: Diagnosis not present

## 2021-12-19 DIAGNOSIS — Z419 Encounter for procedure for purposes other than remedying health state, unspecified: Secondary | ICD-10-CM | POA: Diagnosis not present

## 2022-01-06 DIAGNOSIS — H353211 Exudative age-related macular degeneration, right eye, with active choroidal neovascularization: Secondary | ICD-10-CM | POA: Diagnosis not present

## 2022-01-06 DIAGNOSIS — H353123 Nonexudative age-related macular degeneration, left eye, advanced atrophic without subfoveal involvement: Secondary | ICD-10-CM | POA: Diagnosis not present

## 2022-01-06 DIAGNOSIS — Z961 Presence of intraocular lens: Secondary | ICD-10-CM | POA: Diagnosis not present

## 2022-01-19 DIAGNOSIS — H04123 Dry eye syndrome of bilateral lacrimal glands: Secondary | ICD-10-CM | POA: Diagnosis not present

## 2022-01-19 DIAGNOSIS — H52203 Unspecified astigmatism, bilateral: Secondary | ICD-10-CM | POA: Diagnosis not present

## 2022-01-19 DIAGNOSIS — H353211 Exudative age-related macular degeneration, right eye, with active choroidal neovascularization: Secondary | ICD-10-CM | POA: Diagnosis not present

## 2022-01-19 DIAGNOSIS — Z961 Presence of intraocular lens: Secondary | ICD-10-CM | POA: Diagnosis not present

## 2022-02-10 DIAGNOSIS — H353211 Exudative age-related macular degeneration, right eye, with active choroidal neovascularization: Secondary | ICD-10-CM | POA: Diagnosis not present

## 2022-02-10 DIAGNOSIS — H353123 Nonexudative age-related macular degeneration, left eye, advanced atrophic without subfoveal involvement: Secondary | ICD-10-CM | POA: Diagnosis not present

## 2022-02-10 DIAGNOSIS — Z961 Presence of intraocular lens: Secondary | ICD-10-CM | POA: Diagnosis not present

## 2022-04-29 ENCOUNTER — Ambulatory Visit (INDEPENDENT_AMBULATORY_CARE_PROVIDER_SITE_OTHER): Payer: Medicare HMO | Admitting: Obstetrics & Gynecology

## 2022-04-29 ENCOUNTER — Other Ambulatory Visit (HOSPITAL_COMMUNITY)
Admission: RE | Admit: 2022-04-29 | Discharge: 2022-04-29 | Disposition: A | Discharge status: Home / Self Care | Payer: Medicare HMO | Source: Ambulatory Visit | Attending: Obstetrics & Gynecology | Admitting: Obstetrics & Gynecology

## 2022-04-29 VITALS — BP 143/78 | HR 63 | Ht 62.0 in | Wt 126.8 lb

## 2022-04-29 DIAGNOSIS — Z124 Encounter for screening for malignant neoplasm of cervix: Secondary | ICD-10-CM | POA: Diagnosis not present

## 2022-04-29 DIAGNOSIS — E785 Hyperlipidemia, unspecified: Secondary | ICD-10-CM

## 2022-04-29 DIAGNOSIS — R030 Elevated blood-pressure reading, without diagnosis of hypertension: Secondary | ICD-10-CM | POA: Diagnosis not present

## 2022-04-29 DIAGNOSIS — Z9189 Other specified personal risk factors, not elsewhere classified: Secondary | ICD-10-CM

## 2022-04-29 DIAGNOSIS — Z823 Family history of stroke: Secondary | ICD-10-CM

## 2022-04-29 NOTE — Progress Notes (Signed)
76 y.o. G64P2002 Married White or Caucasian female here for breast and pelvic exam.  Denies vaginal bleeding.  Doing well.  H/O HSV.  Uses valtrex for outbreaks.  Doesn't needs RF right now.  We discussed recent lab work and elevated lipids.  She has family history of stroke in her father.  On two agents.  Not sure if she where she needs to be from management standpoint.  Has not had coronary calcium score.  Feel with questions, she would benefit from seeing lipid specialist   Patient's last menstrual period was 07/27/2001.          Sexually active: No.   Health Maintenance: PCP:  Dr. Brigitte Pulse.  Last wellness appt was early in 2023.  Did blood work at that appt:  yes Vaccines are up to date:  tdap discussed Colonoscopy:  06/10/2021 MMG:  11/03/2021 Negative BMD:  09/28/2019, -1.4  Last pap smear:  04/07/2019 Negative.   H/o abnormal pap smear:  no   reports that she has quit smoking. She has never used smokeless tobacco. She reports current alcohol use of about 4.0 standard drinks of alcohol per week. She reports that she does not use drugs.  Past Medical History:  Diagnosis Date   Anemia    past hx , not current   Cataract    removed bilat with lens implants   Colon polyps    pt thinks she has had polyps removed at some point in the past   Elevated cholesterol    Macular degeneration    Osteopenia    STD (sexually transmitted disease)    HSV1    Past Surgical History:  Procedure Laterality Date   BACK SURGERY     BREAST BIOPSY Right 2020   CATARACT EXTRACTION Bilateral    with lens implants   COLONOSCOPY     COSMETIC SURGERY     eyes & under chin    Current Outpatient Medications  Medication Sig Dispense Refill   Cholecalciferol (VITAMIN D) 2000 UNITS tablet Take 2,000 Units by mouth daily.      ezetimibe (ZETIA) 10 MG tablet Take 10 mg by mouth daily.     Multiple Vitamins-Minerals (PRESERVISION AREDS 2 PO) Take by mouth.     Propylene Glycol (SYSTANE BALANCE OP) Apply  to eye.     rosuvastatin (CRESTOR) 10 MG tablet Take 1 tablet (10 mg total) by mouth daily.     tobramycin (TOBREX) 0.3 % ophthalmic solution      valACYclovir (VALTREX) 1000 MG tablet Take one tablet twice daily x one day only at onset 30 tablet 2   No current facility-administered medications for this visit.    Family History  Problem Relation Age of Onset   Colon cancer Father    Cancer Father        colon   Hypertension Father    Stroke Father    Colon polyps Neg Hx    Esophageal cancer Neg Hx    Rectal cancer Neg Hx    Stomach cancer Neg Hx     Review of Systems  Constitutional: Negative.   Genitourinary: Negative.     Exam:   LMP 07/27/2001      General appearance: alert, cooperative and appears stated age Breasts: normal appearance, no masses or tenderness Abdomen: soft, non-tender; bowel sounds normal; no masses,  no organomegaly Lymph nodes: Cervical, supraclavicular, and axillary nodes normal.  No abnormal inguinal nodes palpated Neurologic: Grossly normal  Pelvic: External genitalia:  no lesions  Urethra:  normal appearing urethra with no masses, tenderness or lesions              Bartholins and Skenes: normal                 Vagina: normal appearing vagina with atrophic changes and no discharge, no lesions              Cervix: no lesions              Pap taken: Yes.   Bimanual Exam:  Uterus:  normal size, contour, position, consistency, mobility, non-tender              Adnexa: normal adnexa and no mass, fullness, tenderness               Rectovaginal: Confirms               Anus:  normal sphincter tone, no lesions  Chaperone, Octaviano Batty, CMA, was present for exam.  Assessment/Plan: 1. GYN exam for high-risk Medicare patient - Pap smear obtained today - Mammogram 10/2021 - Colonoscopy 06/10/2021 - Bone mineral density 09/2019 - lab work done done with Dr. Brigitte Pulse - vaccines reviewed/updated  2. Cervical cancer screening - Cytology - PAP(  [facility]) - PR OBTAINING SCREEN PAP SMEAR  3. HSV 1  4. Elevated lipids - Ambulatory referral to Cardiology

## 2022-05-02 ENCOUNTER — Encounter (HOSPITAL_BASED_OUTPATIENT_CLINIC_OR_DEPARTMENT_OTHER): Payer: Self-pay | Admitting: Obstetrics & Gynecology

## 2022-05-02 DIAGNOSIS — Z823 Family history of stroke: Secondary | ICD-10-CM | POA: Insufficient documentation

## 2022-05-04 LAB — CYTOLOGY - PAP: Diagnosis: NEGATIVE

## 2022-05-05 ENCOUNTER — Ambulatory Visit (HOSPITAL_BASED_OUTPATIENT_CLINIC_OR_DEPARTMENT_OTHER): Payer: Medicare HMO | Admitting: Obstetrics & Gynecology

## 2022-05-05 DIAGNOSIS — Z961 Presence of intraocular lens: Secondary | ICD-10-CM | POA: Diagnosis not present

## 2022-05-05 DIAGNOSIS — H353211 Exudative age-related macular degeneration, right eye, with active choroidal neovascularization: Secondary | ICD-10-CM | POA: Diagnosis not present

## 2022-05-05 DIAGNOSIS — H353123 Nonexudative age-related macular degeneration, left eye, advanced atrophic without subfoveal involvement: Secondary | ICD-10-CM | POA: Diagnosis not present

## 2022-08-21 DIAGNOSIS — M8580 Other specified disorders of bone density and structure, unspecified site: Secondary | ICD-10-CM | POA: Diagnosis not present

## 2022-08-21 DIAGNOSIS — M859 Disorder of bone density and structure, unspecified: Secondary | ICD-10-CM | POA: Diagnosis not present

## 2022-08-21 DIAGNOSIS — R7989 Other specified abnormal findings of blood chemistry: Secondary | ICD-10-CM | POA: Diagnosis not present

## 2022-08-21 DIAGNOSIS — R7301 Impaired fasting glucose: Secondary | ICD-10-CM | POA: Diagnosis not present

## 2022-08-21 DIAGNOSIS — R002 Palpitations: Secondary | ICD-10-CM | POA: Diagnosis not present

## 2022-08-21 DIAGNOSIS — E785 Hyperlipidemia, unspecified: Secondary | ICD-10-CM | POA: Diagnosis not present

## 2022-08-21 DIAGNOSIS — K219 Gastro-esophageal reflux disease without esophagitis: Secondary | ICD-10-CM | POA: Diagnosis not present

## 2022-08-25 DIAGNOSIS — H353211 Exudative age-related macular degeneration, right eye, with active choroidal neovascularization: Secondary | ICD-10-CM | POA: Diagnosis not present

## 2022-08-25 DIAGNOSIS — H353123 Nonexudative age-related macular degeneration, left eye, advanced atrophic without subfoveal involvement: Secondary | ICD-10-CM | POA: Diagnosis not present

## 2022-08-25 DIAGNOSIS — H35363 Drusen (degenerative) of macula, bilateral: Secondary | ICD-10-CM | POA: Diagnosis not present

## 2022-08-25 DIAGNOSIS — H3581 Retinal edema: Secondary | ICD-10-CM | POA: Diagnosis not present

## 2022-08-28 DIAGNOSIS — H353210 Exudative age-related macular degeneration, right eye, stage unspecified: Secondary | ICD-10-CM | POA: Diagnosis not present

## 2022-08-28 DIAGNOSIS — D692 Other nonthrombocytopenic purpura: Secondary | ICD-10-CM | POA: Diagnosis not present

## 2022-08-28 DIAGNOSIS — Z1339 Encounter for screening examination for other mental health and behavioral disorders: Secondary | ICD-10-CM | POA: Diagnosis not present

## 2022-08-28 DIAGNOSIS — Z1331 Encounter for screening for depression: Secondary | ICD-10-CM | POA: Diagnosis not present

## 2022-08-28 DIAGNOSIS — M8580 Other specified disorders of bone density and structure, unspecified site: Secondary | ICD-10-CM | POA: Diagnosis not present

## 2022-08-28 DIAGNOSIS — I2510 Atherosclerotic heart disease of native coronary artery without angina pectoris: Secondary | ICD-10-CM | POA: Diagnosis not present

## 2022-08-28 DIAGNOSIS — Z800 Family history of malignant neoplasm of digestive organs: Secondary | ICD-10-CM | POA: Diagnosis not present

## 2022-08-28 DIAGNOSIS — B009 Herpesviral infection, unspecified: Secondary | ICD-10-CM | POA: Diagnosis not present

## 2022-08-28 DIAGNOSIS — E785 Hyperlipidemia, unspecified: Secondary | ICD-10-CM | POA: Diagnosis not present

## 2022-08-28 DIAGNOSIS — R82998 Other abnormal findings in urine: Secondary | ICD-10-CM | POA: Diagnosis not present

## 2022-08-28 DIAGNOSIS — R7301 Impaired fasting glucose: Secondary | ICD-10-CM | POA: Diagnosis not present

## 2022-08-28 DIAGNOSIS — Z0000 Encounter for general adult medical examination without abnormal findings: Secondary | ICD-10-CM | POA: Diagnosis not present

## 2022-08-28 DIAGNOSIS — K219 Gastro-esophageal reflux disease without esophagitis: Secondary | ICD-10-CM | POA: Diagnosis not present

## 2022-09-01 ENCOUNTER — Other Ambulatory Visit: Payer: Self-pay | Admitting: Internal Medicine

## 2022-09-01 DIAGNOSIS — M8580 Other specified disorders of bone density and structure, unspecified site: Secondary | ICD-10-CM

## 2022-09-03 ENCOUNTER — Other Ambulatory Visit: Payer: Self-pay | Admitting: Internal Medicine

## 2022-09-03 DIAGNOSIS — Z1231 Encounter for screening mammogram for malignant neoplasm of breast: Secondary | ICD-10-CM

## 2022-10-28 ENCOUNTER — Encounter (HOSPITAL_BASED_OUTPATIENT_CLINIC_OR_DEPARTMENT_OTHER): Payer: Self-pay | Admitting: Internal Medicine

## 2022-10-28 ENCOUNTER — Ambulatory Visit (HOSPITAL_BASED_OUTPATIENT_CLINIC_OR_DEPARTMENT_OTHER): Payer: Medicare HMO | Admitting: Internal Medicine

## 2022-10-28 VITALS — BP 124/80 | HR 72 | Ht 62.0 in | Wt 126.0 lb

## 2022-10-28 DIAGNOSIS — R931 Abnormal findings on diagnostic imaging of heart and coronary circulation: Secondary | ICD-10-CM

## 2022-10-28 DIAGNOSIS — E785 Hyperlipidemia, unspecified: Secondary | ICD-10-CM | POA: Diagnosis not present

## 2022-10-28 NOTE — Patient Instructions (Signed)
Medication Instructions:  NO CHANGES today   *If you need a refill on your cardiac medications before your next appointment, please call your pharmacy*   Lab Work: NMR lipoprofile and LPa today   If you have labs (blood work) drawn today and your tests are completely normal, you will receive your results only by: [place] (if you have MyChart) OR A paper copy in the mail If you have any lab test that is abnormal or we need to change your treatment, we will call you to review the results.   Testing/Procedures: Dr. Debara Pickett has ordered a CT coronary calcium score.   Test locations:  [place]   This is $99 out of pocket.   Coronary CalciumScan A coronary calcium scan is an imaging test used to look for deposits of calcium and other fatty materials (plaques) in the inner lining of the blood vessels of the heart (coronary arteries). These deposits of calcium and plaques can partly clog and narrow the coronary arteries without producing any symptoms or warning signs. This puts a person at risk for a heart attack. This test can detect these deposits before symptoms develop. Tell a health care provider about: Any allergies you have. All medicines you are taking, including vitamins, herbs, eye drops, creams, and over-the-counter medicines. Any problems you or family members have had with anesthetic medicines. Any blood disorders you have. Any surgeries you have had. Any medical conditions you have. Whether you are pregnant or may be pregnant. What are the risks? Generally, this is a safe procedure. However, problems may occur, including: Harm to a pregnant woman and her unborn baby. This test involves the use of radiation. Radiation exposure can be dangerous to a pregnant woman and her unborn baby. If you are pregnant, you generally should not have this procedure done. Slight increase in the risk of cancer. This is because of the radiation involved in  the test. What happens before the procedure? No preparation is needed for this procedure. What happens during the procedure? You will undress and remove any jewelry around your neck or chest. You will put on a hospital gown. Sticky electrodes will be placed on your chest. The electrodes will be connected to an electrocardiogram (ECG) machine to record a tracing of the electrical activity of your heart. A CT scanner will take pictures of your heart. During this time, you will be asked to lie still and hold your breath for 2-3 seconds while a picture of your heart is being taken. The procedure may vary among health care providers and hospitals. What happens after the procedure? You can get dressed. You can return to your normal activities. It is up to you to get the results of your test. Ask your health care provider, or the department that is doing the test, when your results will be ready. Summary A coronary calcium scan is an imaging test used to look for deposits of calcium and other fatty materials (plaques) in the inner lining of the blood vessels of the heart (coronary arteries). Generally, this is a safe procedure. Tell your health care provider if you are pregnant or may be pregnant. No preparation is needed for this procedure. A CT scanner will take pictures of your heart. You can return to your normal activities after the scan is done. This information is not intended to replace advice given to you by your health care provider. Make sure you discuss any questions you have with your health care provider.  Document Released: 01/09/2008 Document Revised: 06/01/2016 Document Reviewed: 06/01/2016 Elsevier Interactive Patient Education  2017 [place]: At [facility], you and your health needs are our priority.  As part of our continuing mission to provide you with exceptional heart care, we have created designated Provider Care Teams.  These Care Teams include  your primary Cardiologist (physician) and Advanced Practice Providers (APPs -  Physician Assistants and Nurse Practitioners) who all work together to provide you with the care you need, when you need it.  We recommend signing up for the patient portal called "MyChart".  Sign up information is provided on this After Visit Summary.  MyChart is used to connect with patients for Virtual Visits (Telemedicine).  Patients are able to view lab/test results, encounter notes, upcoming appointments, etc.  Non-urgent messages can be sent to your provider as well.   To learn more about what you can do with MyChart, go to [email or web address].    Your next appointment:    AS NEEDED/pending results

## 2022-10-29 LAB — NMR, LIPOPROFILE
Cholesterol, Total: 165 mg/dL (ref 100–199)
HDL Particle Number: 55.2 umol/L (ref >=30.5)
HDL-C: 92 mg/dL (ref >39)
LDL Particle Number: 454 nmol/L (ref <1000)
LDL Size: 20.9 nm (ref >20.5)
LDL-C (NIH Calc): 62 mg/dL (ref 0–99)
LP-IR Score: <25 (ref <=45)
Small LDL Particle Number: <90 nmol/L (ref <=527)
Triglycerides: 55 mg/dL (ref 0–149)

## 2022-10-29 LAB — LIPOPROTEIN A (LPA): Lipoprotein (a): 31.7 nmol/L (ref <75.0)

## 2022-10-30 NOTE — Progress Notes (Addendum)
LIPID CLINIC CONSULT NOTE  Chief Complaint:  Manage dyslipidemia  Primary Care Physician: Cleatis Polka., MD  Primary Cardiologist:  None  HPI:  Yolanda Nicholson is a 77 y.o. female who is being seen today for the evaluation of dyslipidemia at the request of Jerene Bears, MD. this is a pleasant 77 year old female kindly referred to evaluation management of dyslipidemia.  She has a history of abnormal coronary calcium score which was 299, 91st percentile for age and sex matched controls in October 2015.  She has had progressive dyslipidemia with a target LDL less than 70.  Recently she had a mild increase in her cholesterol with an LDL that went from 72 up to 78 over the past year.  Total cholesterol 170, triglycerides 55 and HDL 89.  This is quite a small change and her medications were not adjusted by her PCP.  She was referred to, however by her gynecologist who felt that her trend of increasing cholesterol was concerning.Marland Kitchen  PMHx:  Past Medical History:  Diagnosis Date   Anemia    past hx , not current   Cataract    removed bilat with lens implants   Colon polyps    pt thinks she has had polyps removed at some point in the past   Elevated cholesterol    Macular degeneration    Osteopenia    STD (sexually transmitted disease)    HSV1    Past Surgical History:  Procedure Laterality Date   BACK SURGERY     BREAST BIOPSY Right 2020   CATARACT EXTRACTION Bilateral    with lens implants   COLONOSCOPY     COSMETIC SURGERY     eyes & under chin    FAMHx:  Family History  Problem Relation Age of Onset   Colon cancer Father    Cancer Father        colon   Hypertension Father    Stroke Father    Colon polyps Neg Hx    Esophageal cancer Neg Hx    Rectal cancer Neg Hx    Stomach cancer Neg Hx     SOCHx:   reports that she has quit smoking. She has never used smokeless tobacco. She reports current alcohol use of about 4.0 standard drinks of alcohol per  week. She reports that she does not use drugs.  ALLERGIES:  Allergies  Allergen Reactions   Betadine [Povidone Iodine]     Redness & soreness   Penicillins Rash   Sulfa Antibiotics Rash    ROS: Pertinent items noted in HPI and remainder of comprehensive ROS otherwise negative.  HOME MEDS: Current Outpatient Medications on File Prior to Visit  Medication Sig Dispense Refill   Cholecalciferol (VITAMIN D) 2000 UNITS tablet Take 2,000 Units by mouth daily.      ezetimibe (ZETIA) 10 MG tablet Take 10 mg by mouth daily.     Multiple Vitamins-Minerals (PRESERVISION AREDS 2 PO) Take by mouth.     Propylene Glycol (SYSTANE BALANCE OP) Apply to eye.     rosuvastatin (CRESTOR) 20 MG tablet Take 20 mg by mouth daily.     tobramycin (TOBREX) 0.3 % ophthalmic solution      No current facility-administered medications on file prior to visit.    LABS/IMAGING: No results found for this or any previous visit (from the past 48 hour(s)). No results found.  LIPID PANEL: No results found for: "CHOL", "TRIG", "HDL", "CHOLHDL", "VLDL", "LDLCALC", "LDLDIRECT"  WEIGHTS: Wt  Readings from Last 3 Encounters:  10/28/22 126 lb (57.2 kg)  04/29/22 126 lb 12.8 oz (57.5 kg)  06/10/21 121 lb (54.9 kg)    VITALS: BP 124/80   Pulse 72   Ht 5\' 2"  (1.575 m)   Wt 126 lb (57.2 kg)   LMP 07/27/2001   BMI 23.05 kg/m   EXAM: Deferred  EKG: Deferred  ASSESSMENT: Mixed dyslipidemia, goal LDL <70 Abnormal CAC score of 299, 91st percentile (2015) Mild cardiomegaly (noted on CT)  PLAN: 1.   Ms. Donnie Ahoilley has had a mild increase in her cholesterol but this could be explained by a number of factors including aging, dietary discretions or changes in exercise.  She has been compliant with her medications.  I will continue to work on lifestyle modifications along with her medications.  As her calcium score was nearly 10 years ago, it is reasonable to consider reassessment of this.  I explained to her in detail  that is likely the calcium score will go up however if the percentile ranking is comparatively lower than it would suggest that medical therapy has been effective.  She is in agreement with this.  I will contact her with those results and if necessary make any further changes to her medications.  We will go ahead and repeat a lipid profile, specifically an NMR and add LP(a) at this time as well.  Thanks again for the kind referral.  Chrystie NoseKenneth C. Jeneal Vogl, MD, [facility] Childrens HospitalFACC, FACP  [place]  [facility]  Medical Director of the [facility]  [facility] Diplomate of the American Board of Clinical Lipidology Attending Cardiologist  Direct Dial: [phone]  Fax: [phone]  Website:  [email or web address]  Chrystie NoseKenneth C Camryn Quesinberry 10/30/2022, 6:13 PM

## 2022-11-05 ENCOUNTER — Ambulatory Visit
Admission: RE | Admit: 2022-11-05 | Discharge: 2022-11-05 | Disposition: A | Discharge status: Home / Self Care | Payer: Medicare HMO | Source: Ambulatory Visit | Attending: Internal Medicine | Admitting: Internal Medicine

## 2022-11-05 DIAGNOSIS — Z1231 Encounter for screening mammogram for malignant neoplasm of breast: Secondary | ICD-10-CM

## 2022-11-18 ENCOUNTER — Ambulatory Visit (HOSPITAL_BASED_OUTPATIENT_CLINIC_OR_DEPARTMENT_OTHER)
Admission: RE | Admit: 2022-11-18 | Discharge: 2022-11-18 | Disposition: A | Discharge status: Home / Self Care | Payer: Medicare HMO | Source: Ambulatory Visit | Attending: Internal Medicine | Admitting: Internal Medicine

## 2022-11-18 DIAGNOSIS — E785 Hyperlipidemia, unspecified: Secondary | ICD-10-CM | POA: Insufficient documentation

## 2022-12-29 DIAGNOSIS — H353211 Exudative age-related macular degeneration, right eye, with active choroidal neovascularization: Secondary | ICD-10-CM | POA: Diagnosis not present

## 2023-01-22 DIAGNOSIS — H524 Presbyopia: Secondary | ICD-10-CM | POA: Diagnosis not present

## 2023-01-22 DIAGNOSIS — H5213 Myopia, bilateral: Secondary | ICD-10-CM | POA: Diagnosis not present

## 2023-01-22 DIAGNOSIS — H04123 Dry eye syndrome of bilateral lacrimal glands: Secondary | ICD-10-CM | POA: Diagnosis not present

## 2023-01-22 DIAGNOSIS — H43813 Vitreous degeneration, bilateral: Secondary | ICD-10-CM | POA: Diagnosis not present

## 2023-01-22 DIAGNOSIS — H52201 Unspecified astigmatism, right eye: Secondary | ICD-10-CM | POA: Diagnosis not present

## 2023-01-22 DIAGNOSIS — H353211 Exudative age-related macular degeneration, right eye, with active choroidal neovascularization: Secondary | ICD-10-CM | POA: Diagnosis not present

## 2023-01-22 DIAGNOSIS — Z0100 Encounter for examination of eyes and vision without abnormal findings: Secondary | ICD-10-CM | POA: Diagnosis not present

## 2023-01-27 ENCOUNTER — Encounter: Payer: Self-pay | Admitting: Internal Medicine

## 2023-01-27 ENCOUNTER — Ambulatory Visit
Admission: RE | Admit: 2023-01-27 | Discharge: 2023-01-27 | Disposition: A | Discharge status: Home / Self Care | Payer: Medicare HMO | Source: Ambulatory Visit | Attending: Internal Medicine | Admitting: Internal Medicine

## 2023-01-27 DIAGNOSIS — E349 Endocrine disorder, unspecified: Secondary | ICD-10-CM | POA: Diagnosis not present

## 2023-01-27 DIAGNOSIS — M8580 Other specified disorders of bone density and structure, unspecified site: Secondary | ICD-10-CM

## 2023-01-27 DIAGNOSIS — N958 Other specified menopausal and perimenopausal disorders: Secondary | ICD-10-CM | POA: Diagnosis not present

## 2023-01-27 DIAGNOSIS — M8588 Other specified disorders of bone density and structure, other site: Secondary | ICD-10-CM | POA: Diagnosis not present

## 2023-02-23 DIAGNOSIS — M810 Age-related osteoporosis without current pathological fracture: Secondary | ICD-10-CM | POA: Diagnosis not present

## 2023-03-03 DIAGNOSIS — R591 Generalized enlarged lymph nodes: Secondary | ICD-10-CM | POA: Diagnosis not present

## 2023-03-03 DIAGNOSIS — R197 Diarrhea, unspecified: Secondary | ICD-10-CM | POA: Diagnosis not present

## 2023-03-08 DIAGNOSIS — L820 Inflamed seborrheic keratosis: Secondary | ICD-10-CM | POA: Diagnosis not present

## 2023-03-08 DIAGNOSIS — L218 Other seborrheic dermatitis: Secondary | ICD-10-CM | POA: Diagnosis not present

## 2023-03-08 DIAGNOSIS — L738 Other specified follicular disorders: Secondary | ICD-10-CM | POA: Diagnosis not present

## 2023-03-08 DIAGNOSIS — L821 Other seborrheic keratosis: Secondary | ICD-10-CM | POA: Diagnosis not present

## 2023-03-08 DIAGNOSIS — D2262 Melanocytic nevi of left upper limb, including shoulder: Secondary | ICD-10-CM | POA: Diagnosis not present

## 2023-03-15 DIAGNOSIS — R197 Diarrhea, unspecified: Secondary | ICD-10-CM | POA: Diagnosis not present

## 2023-03-15 DIAGNOSIS — D72825 Bandemia: Secondary | ICD-10-CM | POA: Diagnosis not present

## 2023-03-15 DIAGNOSIS — R109 Unspecified abdominal pain: Secondary | ICD-10-CM | POA: Diagnosis not present

## 2023-03-15 DIAGNOSIS — K5792 Diverticulitis of intestine, part unspecified, without perforation or abscess without bleeding: Secondary | ICD-10-CM | POA: Diagnosis not present

## 2023-03-15 DIAGNOSIS — E876 Hypokalemia: Secondary | ICD-10-CM | POA: Diagnosis not present

## 2023-03-15 DIAGNOSIS — D649 Anemia, unspecified: Secondary | ICD-10-CM | POA: Diagnosis not present

## 2023-03-22 DIAGNOSIS — E785 Hyperlipidemia, unspecified: Secondary | ICD-10-CM | POA: Diagnosis not present

## 2023-03-22 DIAGNOSIS — K5792 Diverticulitis of intestine, part unspecified, without perforation or abscess without bleeding: Secondary | ICD-10-CM | POA: Diagnosis not present

## 2023-04-19 DIAGNOSIS — I2510 Atherosclerotic heart disease of native coronary artery without angina pectoris: Secondary | ICD-10-CM | POA: Diagnosis not present

## 2023-04-19 DIAGNOSIS — E785 Hyperlipidemia, unspecified: Secondary | ICD-10-CM | POA: Diagnosis not present

## 2023-04-19 DIAGNOSIS — E876 Hypokalemia: Secondary | ICD-10-CM | POA: Diagnosis not present

## 2023-04-20 DIAGNOSIS — H353211 Exudative age-related macular degeneration, right eye, with active choroidal neovascularization: Secondary | ICD-10-CM | POA: Diagnosis not present

## 2023-05-27 DIAGNOSIS — Z800 Family history of malignant neoplasm of digestive organs: Secondary | ICD-10-CM | POA: Diagnosis not present

## 2023-05-27 DIAGNOSIS — K5790 Diverticulosis of intestine, part unspecified, without perforation or abscess without bleeding: Secondary | ICD-10-CM | POA: Diagnosis not present

## 2023-05-31 ENCOUNTER — Encounter (HOSPITAL_BASED_OUTPATIENT_CLINIC_OR_DEPARTMENT_OTHER): Payer: Self-pay | Admitting: Obstetrics & Gynecology

## 2023-05-31 ENCOUNTER — Ambulatory Visit (HOSPITAL_BASED_OUTPATIENT_CLINIC_OR_DEPARTMENT_OTHER): Payer: Medicare HMO | Admitting: Obstetrics & Gynecology

## 2023-05-31 VITALS — BP 151/79 | HR 67 | Ht 62.5 in | Wt 122.0 lb

## 2023-05-31 DIAGNOSIS — Z9189 Other specified personal risk factors, not elsewhere classified: Secondary | ICD-10-CM

## 2023-05-31 DIAGNOSIS — B009 Herpesviral infection, unspecified: Secondary | ICD-10-CM

## 2023-05-31 DIAGNOSIS — D251 Intramural leiomyoma of uterus: Secondary | ICD-10-CM | POA: Diagnosis not present

## 2023-05-31 DIAGNOSIS — E7800 Pure hypercholesterolemia, unspecified: Secondary | ICD-10-CM

## 2023-05-31 DIAGNOSIS — M810 Age-related osteoporosis without current pathological fracture: Secondary | ICD-10-CM | POA: Diagnosis not present

## 2023-05-31 NOTE — Progress Notes (Unsigned)
77 y.o. G61P2002 Married White or Caucasian female here for breast and pelvic exam.  I am also following her for PMP status.  Denies vaginal bleeding.\  Had a bad GI summer.  Diarrhea and then she had diverticulitis.    Patient's last menstrual period was 07/27/2001.          Sexually active: {yes no:314532}    Health Maintenance: PCP:  Dr. Clelia Croft.  Last wellness appt was 07/2022.  Did blood work at that appt:  yes Vaccines are up to date:  yes Colonoscopy:  2022, follow up 5 years MMG:  10/2022 BMD:  2024 Last pap smear:  2023.   H/o abnormal pap smear:  no    reports that she has quit smoking. She has never used smokeless tobacco. She reports current alcohol use of about 4.0 standard drinks of alcohol per week. She reports that she does not use drugs.  Past Medical History:  Diagnosis Date   Anemia    past hx , not current   Cataract    removed bilat with lens implants   Colon polyps    pt thinks she has had polyps removed at some point in the past   Diverticulitis    Elevated cholesterol    Macular degeneration    Osteopenia    STD (sexually transmitted disease)    HSV1    Past Surgical History:  Procedure Laterality Date   BACK SURGERY     BREAST BIOPSY Right 2020   CATARACT EXTRACTION Bilateral    with lens implants   COLONOSCOPY     COSMETIC SURGERY     eyes & under chin    Current Outpatient Medications  Medication Sig Dispense Refill   alendronate (FOSAMAX) 70 MG tablet Take 70 mg by mouth once a week.     Cholecalciferol (VITAMIN D) 2000 UNITS tablet Take 2,000 Units by mouth daily.      ezetimibe (ZETIA) 10 MG tablet Take 10 mg by mouth daily.     Multiple Vitamins-Minerals (PRESERVISION AREDS 2 PO) Take by mouth.     Propylene Glycol (SYSTANE BALANCE OP) Apply to eye.     rosuvastatin (CRESTOR) 20 MG tablet Take 20 mg by mouth daily.     tobramycin (TOBREX) 0.3 % ophthalmic solution  (Patient not taking: Reported on 05/31/2023)     No current  facility-administered medications for this visit.    Family History  Problem Relation Age of Onset   Colon cancer Father    Cancer Father        colon   Hypertension Father    Stroke Father    Colon polyps Neg Hx    Esophageal cancer Neg Hx    Rectal cancer Neg Hx    Stomach cancer Neg Hx     Review of Systems  Constitutional: Negative.   Genitourinary: Negative.     Exam:   LMP 07/27/2001      General appearance: alert, cooperative and appears stated age Breasts: normal appearance, no masses or tenderness Abdomen: soft, non-tender; bowel sounds normal; no masses,  no organomegaly Lymph nodes: Cervical, supraclavicular, and axillary nodes normal.  No abnormal inguinal nodes palpated Neurologic: Grossly normal  Pelvic: External genitalia:  no lesions              Urethra:  normal appearing urethra with no masses, tenderness or lesions              Bartholins and Skenes: normal  Vagina: normal appearing vagina with atrophic changes and no discharge, no lesions              Cervix: no lesions              Pap taken: No. Bimanual Exam:  Uterus:  normal size, contour, position, consistency, mobility, non-tender              Adnexa: normal adnexa and no mass, fullness, tenderness               Rectovaginal: Confirms               Anus:  normal sphincter tone, no lesions  Chaperone, Ina Homes, CMA, was present for exam.  Assessment/Plan: There are no diagnoses linked to this encounter.

## 2023-06-02 ENCOUNTER — Encounter (HOSPITAL_BASED_OUTPATIENT_CLINIC_OR_DEPARTMENT_OTHER): Payer: Self-pay | Admitting: Obstetrics & Gynecology

## 2023-06-02 DIAGNOSIS — M810 Age-related osteoporosis without current pathological fracture: Secondary | ICD-10-CM | POA: Insufficient documentation

## 2023-08-30 DIAGNOSIS — R7301 Impaired fasting glucose: Secondary | ICD-10-CM | POA: Diagnosis not present

## 2023-08-30 DIAGNOSIS — M810 Age-related osteoporosis without current pathological fracture: Secondary | ICD-10-CM | POA: Diagnosis not present

## 2023-08-30 DIAGNOSIS — E785 Hyperlipidemia, unspecified: Secondary | ICD-10-CM | POA: Diagnosis not present

## 2023-08-30 DIAGNOSIS — I2510 Atherosclerotic heart disease of native coronary artery without angina pectoris: Secondary | ICD-10-CM | POA: Diagnosis not present

## 2023-09-06 DIAGNOSIS — Z1339 Encounter for screening examination for other mental health and behavioral disorders: Secondary | ICD-10-CM | POA: Diagnosis not present

## 2023-09-06 DIAGNOSIS — R7301 Impaired fasting glucose: Secondary | ICD-10-CM | POA: Diagnosis not present

## 2023-09-06 DIAGNOSIS — K219 Gastro-esophageal reflux disease without esophagitis: Secondary | ICD-10-CM | POA: Diagnosis not present

## 2023-09-06 DIAGNOSIS — E785 Hyperlipidemia, unspecified: Secondary | ICD-10-CM | POA: Diagnosis not present

## 2023-09-06 DIAGNOSIS — H353210 Exudative age-related macular degeneration, right eye, stage unspecified: Secondary | ICD-10-CM | POA: Diagnosis not present

## 2023-09-06 DIAGNOSIS — Z1331 Encounter for screening for depression: Secondary | ICD-10-CM | POA: Diagnosis not present

## 2023-09-06 DIAGNOSIS — D75839 Thrombocytosis, unspecified: Secondary | ICD-10-CM | POA: Diagnosis not present

## 2023-09-06 DIAGNOSIS — Z23 Encounter for immunization: Secondary | ICD-10-CM | POA: Diagnosis not present

## 2023-09-06 DIAGNOSIS — I2510 Atherosclerotic heart disease of native coronary artery without angina pectoris: Secondary | ICD-10-CM | POA: Diagnosis not present

## 2023-09-06 DIAGNOSIS — R82998 Other abnormal findings in urine: Secondary | ICD-10-CM | POA: Diagnosis not present

## 2023-09-06 DIAGNOSIS — Z0000 Encounter for general adult medical examination without abnormal findings: Secondary | ICD-10-CM | POA: Diagnosis not present

## 2023-09-06 DIAGNOSIS — Z800 Family history of malignant neoplasm of digestive organs: Secondary | ICD-10-CM | POA: Diagnosis not present

## 2023-09-06 DIAGNOSIS — M810 Age-related osteoporosis without current pathological fracture: Secondary | ICD-10-CM | POA: Diagnosis not present

## 2023-09-23 ENCOUNTER — Other Ambulatory Visit: Payer: Self-pay | Admitting: Internal Medicine

## 2023-09-23 DIAGNOSIS — Z1231 Encounter for screening mammogram for malignant neoplasm of breast: Secondary | ICD-10-CM

## 2023-10-15 ENCOUNTER — Inpatient Hospital Stay: Payer: Medicare HMO

## 2023-10-15 ENCOUNTER — Inpatient Hospital Stay: Payer: Medicare HMO | Attending: Oncology | Admitting: Oncology

## 2023-10-15 ENCOUNTER — Encounter: Payer: Self-pay | Admitting: Oncology

## 2023-10-15 VITALS — BP 127/64 | HR 70 | Temp 97.8°F | Resp 15 | Ht 63.0 in | Wt 122.3 lb

## 2023-10-15 DIAGNOSIS — Z7982 Long term (current) use of aspirin: Secondary | ICD-10-CM | POA: Diagnosis not present

## 2023-10-15 DIAGNOSIS — D75839 Thrombocytosis, unspecified: Secondary | ICD-10-CM

## 2023-10-15 DIAGNOSIS — Z79899 Other long term (current) drug therapy: Secondary | ICD-10-CM | POA: Insufficient documentation

## 2023-10-15 DIAGNOSIS — M810 Age-related osteoporosis without current pathological fracture: Secondary | ICD-10-CM | POA: Diagnosis not present

## 2023-10-15 DIAGNOSIS — D473 Essential (hemorrhagic) thrombocythemia: Secondary | ICD-10-CM | POA: Insufficient documentation

## 2023-10-15 DIAGNOSIS — I2510 Atherosclerotic heart disease of native coronary artery without angina pectoris: Secondary | ICD-10-CM | POA: Diagnosis not present

## 2023-10-15 LAB — CMP (CANCER CENTER ONLY)
ALT: 37 U/L (ref 0–44)
AST: 40 U/L (ref 15–41)
Albumin: 4.5 g/dL (ref 3.5–5.0)
Alkaline Phosphatase: 55 U/L (ref 38–126)
Anion gap: 5 (ref 5–15)
BUN: 15 mg/dL (ref 8–23)
CO2: 28 mmol/L (ref 22–32)
Calcium: 9.3 mg/dL (ref 8.9–10.3)
Chloride: 106 mmol/L (ref 98–111)
Creatinine: 0.54 mg/dL (ref 0.44–1.00)
GFR, Estimated: >60 mL/min (ref >60)
Glucose, Bld: 101 mg/dL — ABNORMAL HIGH (ref 70–99)
Potassium: 3.8 mmol/L (ref 3.5–5.1)
Sodium: 139 mmol/L (ref 135–145)
Total Bilirubin: 0.6 mg/dL (ref 0.0–1.2)
Total Protein: 7.3 g/dL (ref 6.5–8.1)

## 2023-10-15 LAB — IRON AND IRON BINDING CAPACITY (CC-WL,HP ONLY)
Iron: 77 ug/dL (ref 28–170)
Saturation Ratios: 18 % (ref 10.4–31.8)
TIBC: 440 ug/dL (ref 250–450)
UIBC: 363 ug/dL (ref 148–442)

## 2023-10-15 LAB — CBC WITH DIFFERENTIAL (CANCER CENTER ONLY)
Abs Immature Granulocytes: 0.03 10*3/uL (ref 0.00–0.07)
Basophils Absolute: 0 10*3/uL (ref 0.0–0.1)
Basophils Relative: 1 %
Eosinophils Absolute: 0.1 10*3/uL (ref 0.0–0.5)
Eosinophils Relative: 1 %
HCT: 38.2 % (ref 36.0–46.0)
Hemoglobin: 12.2 g/dL (ref 12.0–15.0)
Immature Granulocytes: 0 %
Lymphocytes Relative: 13 %
Lymphs Abs: 1.2 10*3/uL (ref 0.7–4.0)
MCH: 29.1 pg (ref 26.0–34.0)
MCHC: 31.9 g/dL (ref 30.0–36.0)
MCV: 91.2 fL (ref 80.0–100.0)
Monocytes Absolute: 0.9 10*3/uL (ref 0.1–1.0)
Monocytes Relative: 10 %
Neutro Abs: 6.5 10*3/uL (ref 1.7–7.7)
Neutrophils Relative %: 75 %
Platelet Count: 588 10*3/uL — ABNORMAL HIGH (ref 150–400)
RBC: 4.19 MIL/uL (ref 3.87–5.11)
RDW: 13.3 % (ref 11.5–15.5)
WBC Count: 8.8 10*3/uL (ref 4.0–10.5)
nRBC: 0 % (ref 0.0–0.2)

## 2023-10-15 LAB — SEDIMENTATION RATE: Sed Rate: 9 mm/h (ref 0–22)

## 2023-10-15 LAB — LACTATE DEHYDROGENASE: LDH: 156 U/L (ref 98–192)

## 2023-10-15 LAB — FERRITIN: Ferritin: 11 ng/mL (ref 11–307)

## 2023-10-15 LAB — C-REACTIVE PROTEIN: CRP: 0.6 mg/dL (ref <1.0)

## 2023-10-15 NOTE — Progress Notes (Signed)
 [facility]  HEMATOLOGY CLINIC CONSULTATION NOTE  PATIENT NAME: Yolanda Nicholson   MR#: [id number] DOB: 1945/11/18  DATE OF SERVICE: 10/15/2023  Patient Care Team: Cleatis Polka., MD as PCP - General (Internal Medicine)  REASON FOR CONSULTATION/ CHIEF COMPLAINT:  Thrombocytosis  ASSESSMENT & PLAN:  Yolanda Nicholson is a 78 y.o. lady with a past medical history of noncritical coronary artery disease, osteoporosis, diverticulitis, dyslipidemia, macular degeneration, was referred to our service for evaluation of thrombocytosis.  Thrombocytosis Chronic thrombocytosis with platelet counts ranging from 551,000 to 685,000 since August 2024.   Differential diagnosis includes essential thrombocytosis (ET), reactive thrombocytosis due to stress, infection, iron deficiency, or thyroid abnormalities. No history of blood clots, splenomegaly, or significant infections recently.   Labs today showed persistent thrombocytosis but platelet count is better at 588,000.  White count 8800 with normal differential.  Hemoglobin normal at 12.2, MCV 91.2.  CMP unremarkable.  LDH, sed rate, CRP are all within normal limits.  Iron studies showed no evidence of iron deficiency.  Ferritin is borderline low at 11.  We will pursue JAK2 mutation analysis with reflex testing to include CALR, MPL, exon 12-15 mutations to rule out primary myeloproliferative neoplasm.  Discussed the potential need for a bone marrow biopsy if JAK2 or other mutations are positive. If confirmed as ET, treatment with hydroxyurea may be initiated with goal to keep platelet count below 600,000 and prevent complications such as thrombosis, stroke, or cardiovascular events. Hydroxyurea is not started before a bone marrow biopsy to avoid affecting the findings. The risk of transformation to acute myeloid leukemia (AML) is a concern, and treatment aims to prevent this progression.  She has been taking baby aspirin  regularly, which may help reduce clotting risk.  She was advised to continue the same.  - Consider increasing aspirin dosage if traveling, depending on test results.  I will discuss results of remaining workup over the phone with her next week.  Patient will be traveling out of country from 10/28/2023 until 11/14/2023.  I will see her in clinic, once she returns.  Age-related osteoporosis without current pathological fracture She was recently started on Fosamax from July 2024.  Tolerating this well.  Coronary artery disease involving native coronary artery of native heart without angina pectoris Coronary artery disease with high CT-calcium score. No myocardial infarctions or stents. Currently on statin therapy and baby aspirin to minimize risk of myocardial infarction. - Continue statin therapy - Continue baby aspirin once daily   I reviewed lab results and outside records for this visit and discussed relevant results with the patient. Diagnosis, plan of care and treatment options were also discussed in detail with the patient. Opportunity provided to ask questions and answers provided to her apparent satisfaction. Provided instructions to call our clinic with any problems, questions or concerns prior to return visit. I recommended to continue follow-up with PCP and sub-specialists. She verbalized understanding and agreed with the plan. No barriers to learning was detected.  Meryl Crutch, MD  10/15/2023 2:20 PM  [facility] - [facility] [address] [place] [address] Dept: [phone] Dept Fax: [phone]   I spent a total of 55 minutes during this encounter with the patient including review of chart and various tests results, discussions about plan of care and coordination of care plan.  HISTORY OF PRESENTING ILLNESS:   Discussed the use of AI  scribe software for clinical note transcription with the  patient, who gave verbal consent to proceed.   Routine labs at her PCPs office on 08/30/2023 showed platelet count of 682,000.  White count was 7800 with normal differential, hemoglobin 12.1.  Previously labs in September 2024 showed platelet count of 551,000 and labs in August 2024 showed platelet count of 685,000.  Given persistent thrombocytosis, referral was sent to [place] for further evaluation.  In August 2024, the patient experienced a gastrointestinal illness that she suspected was C. diff, but tests were negative. This illness was followed by a bout of diverticulitis, a condition the patient has experienced three times over the past few years. Since this time, the patient has reported a lack of energy and fatigue, which is unusual for her.  The patient is currently on cholesterol medication and takes aspirin regularly. She is planning to travel out of the country soon and has concerns about her health in relation to this trip.  She denies recent infection. The last prescription antibiotics was more than 3 months ago.  Denies any sinus congestion, cough, urinary frequency/urgency or dysuria, diarrhea, joint swelling/pain or abnormal skin rash.   She did not have prior splenectomy  She had no prior history or diagnosis of cancer. Her age appropriate screening programs are up-to-date.  Patient has never been diagnosed with thrombotic events.  MEDICAL HISTORY:  Past Medical History:  Diagnosis Date   Anemia    past hx , not current   Cataract    removed bilat with lens implants   Colon polyps    pt thinks she has had polyps removed at some point in the past   Diverticulitis    Elevated cholesterol    Macular degeneration    Osteopenia    STD (sexually transmitted disease)    HSV1    SURGICAL HISTORY: Past Surgical History:  Procedure Laterality Date   BACK SURGERY     BREAST BIOPSY Right 2020   CATARACT EXTRACTION Bilateral    with lens implants   COLONOSCOPY     COSMETIC  SURGERY     eyes & under chin    SOCIAL HISTORY: Social History   Socioeconomic History   Marital status: Married    Spouse name: Not on file   Number of children: Not on file   Years of education: Not on file   Highest education level: Not on file  Occupational History   Not on file  Tobacco Use   Smoking status: Former   Smokeless tobacco: Never   Tobacco comments:    in college  Vaping Use   Vaping status: Never Used  Substance and Sexual Activity   Alcohol use: Yes    Alcohol/week: 4.0 standard drinks of alcohol    Types: 4 Glasses of wine per week   Drug use: No   Sexual activity: Not Currently    Partners: Male    Birth control/protection: Post-menopausal    Comment: husband vasectomy  Other Topics Concern   Not on file  Social History Narrative   Not on file   Social Drivers of Health   Financial Resource Strain: Not on file  Food Insecurity: No Food Insecurity (10/15/2023)   Hunger Vital Sign    Worried About Running Out of Food in the Last Year: Never true    Ran Out of Food in the Last Year: Never true  Transportation Needs: No Transportation Needs (10/15/2023)   PRAPARE - Transportation    Lack of Transportation (  Medical): No    Lack of Transportation (Non-Medical): No  Physical Activity: Not on file  Stress: Not on file  Social Connections: Not on file  Intimate Partner Violence: Not At Risk (10/15/2023)   Humiliation, Afraid, Rape, and Kick questionnaire    Fear of Current or Ex-Partner: No    Emotionally Abused: No    Physically Abused: No    Sexually Abused: No    FAMILY HISTORY: Family History  Problem Relation Age of Onset   Colon cancer Father    Cancer Father        colon   Hypertension Father    Stroke Father    Colon polyps Neg Hx    Esophageal cancer Neg Hx    Rectal cancer Neg Hx    Stomach cancer Neg Hx     ALLERGIES:  She is allergic to betadine [povidone iodine], penicillins, and sulfa antibiotics.  MEDICATIONS:   Current Outpatient Medications  Medication Sig Dispense Refill   alendronate (FOSAMAX) 70 MG tablet Take 70 mg by mouth once a week.     Cholecalciferol (VITAMIN D) 2000 UNITS tablet Take 2,000 Units by mouth daily.      ezetimibe (ZETIA) 10 MG tablet Take 10 mg by mouth daily.     Multiple Vitamins-Minerals (PRESERVISION AREDS 2 PO) Take by mouth.     Propylene Glycol (SYSTANE BALANCE OP) Apply to eye.     rosuvastatin (CRESTOR) 20 MG tablet Take 20 mg by mouth daily.     tobramycin (TOBREX) 0.3 % ophthalmic solution  (Patient not taking: Reported on 10/15/2023)     No current facility-administered medications for this visit.    REVIEW OF SYSTEMS:    Review of Systems - Oncology  All other pertinent systems were reviewed with the patient and are negative.  PHYSICAL EXAMINATION:    Onc Performance Status - 10/15/23 0950       ECOG Perf Status   ECOG Perf Status Fully active, able to carry on all pre-disease performance without restriction      KPS SCALE   KPS % SCORE Able to carry on normal activity, minor s/s of disease              Vitals:   10/15/23 0942  BP: 127/64  Pulse: 70  Resp: 15  Temp: 97.8 F (36.6 C)  SpO2: 100%   Filed Weights   10/15/23 0942  Weight: 122 lb 4.8 oz (55.5 kg)    Physical Exam Constitutional:      General: She is not in acute distress.    Appearance: Normal appearance.  HENT:     Head: Normocephalic and atraumatic.  Eyes:     General: No scleral icterus.    Conjunctiva/sclera: Conjunctivae normal.  Cardiovascular:     Rate and Rhythm: Normal rate and regular rhythm.     Heart sounds: Normal heart sounds.  Pulmonary:     Effort: Pulmonary effort is normal.     Breath sounds: Normal breath sounds.  Abdominal:     General: There is no distension.     Palpations: Abdomen is soft. There is no mass.  Musculoskeletal:     Right lower leg: No edema.     Left lower leg: No edema.  Lymphadenopathy:     Cervical: No cervical  adenopathy.  Neurological:     General: No focal deficit present.     Mental Status: She is alert and oriented to person, place, and time.  Psychiatric:  Mood and Affect: Mood normal.        Behavior: Behavior normal.        Thought Content: Thought content normal.      LABORATORY DATA:   I have reviewed the data as listed  Results for orders placed or performed in visit on 10/15/23  Ferritin  Result Value Ref Range   Ferritin 11 11 - 307 ng/mL  Iron and Iron Binding Capacity ([facility] only)  Result Value Ref Range   Iron 77 28 - 170 ug/dL   TIBC 409 811 - 914 ug/dL   Saturation Ratios 18 10.4 - 31.8 %   UIBC 363 148 - 442 ug/dL  C-reactive protein  Result Value Ref Range   CRP 0.6 <1.0 mg/dL  Sedimentation rate  Result Value Ref Range   Sed Rate 9 0 - 22 mm/hr  Lactate dehydrogenase  Result Value Ref Range   LDH 156 98 - 192 U/L  CMP (Cancer Center only)  Result Value Ref Range   Sodium 139 135 - 145 mmol/L   Potassium 3.8 3.5 - 5.1 mmol/L   Chloride 106 98 - 111 mmol/L   CO2 28 22 - 32 mmol/L   Glucose, Bld 101 (H) 70 - 99 mg/dL   BUN 15 8 - 23 mg/dL   Creatinine 7.82 9.56 - 1.00 mg/dL   Calcium 9.3 8.9 - 21.3 mg/dL   Total Protein 7.3 6.5 - 8.1 g/dL   Albumin 4.5 3.5 - 5.0 g/dL   AST 40 15 - 41 U/L   ALT 37 0 - 44 U/L   Alkaline Phosphatase 55 38 - 126 U/L   Total Bilirubin 0.6 0.0 - 1.2 mg/dL   GFR, Estimated >08 >65 mL/min   Anion gap 5 5 - 15  CBC with Differential (Cancer Center Only)  Result Value Ref Range   WBC Count 8.8 4.0 - 10.5 K/uL   RBC 4.19 3.87 - 5.11 MIL/uL   Hemoglobin 12.2 12.0 - 15.0 g/dL   HCT 78.4 69.6 - 29.5 %   MCV 91.2 80.0 - 100.0 fL   MCH 29.1 26.0 - 34.0 pg   MCHC 31.9 30.0 - 36.0 g/dL   RDW 28.4 13.2 - 44.0 %   Platelet Count 588 (H) 150 - 400 K/uL   nRBC 0.0 0.0 - 0.2 %   Neutrophils Relative % 75 %   Neutro Abs 6.5 1.7 - 7.7 K/uL   Lymphocytes Relative 13 %   Lymphs Abs 1.2 0.7 - 4.0 K/uL   Monocytes Relative  10 %   Monocytes Absolute 0.9 0.1 - 1.0 K/uL   Eosinophils Relative 1 %   Eosinophils Absolute 0.1 0.0 - 0.5 K/uL   Basophils Relative 1 %   Basophils Absolute 0.0 0.0 - 0.1 K/uL   Immature Granulocytes 0 %   Abs Immature Granulocytes 0.03 0.00 - 0.07 K/uL     RADIOGRAPHIC STUDIES:  No recent pertinent imaging available to review.  Orders Placed This Encounter  Procedures   CBC with Differential (Cancer Center Only)    Standing Status:   Future    Number of Occurrences:   1    Expiration Date:   10/14/2024   CMP (Cancer Center only)    Standing Status:   Future    Number of Occurrences:   1    Expiration Date:   10/14/2024   Lactate dehydrogenase    Standing Status:   Future    Number of Occurrences:   1  Expiration Date:   10/14/2024   Sedimentation rate    Standing Status:   Future    Number of Occurrences:   1    Expiration Date:   10/14/2024   C-reactive protein    Standing Status:   Future    Number of Occurrences:   1    Expiration Date:   10/14/2024   Iron and Iron Binding Capacity ([facility] only)    Standing Status:   Future    Number of Occurrences:   1    Expiration Date:   10/14/2024   Ferritin    Standing Status:   Future    Number of Occurrences:   1    Expiration Date:   10/14/2024   JAK2 V617F rfx CALR/MPL/E12-15    Standing Status:   Future    Number of Occurrences:   1    Expiration Date:   10/14/2024   BCR-ABL1 FISH    Standing Status:   Future    Number of Occurrences:   1    Expiration Date:   10/14/2024    Future Appointments  Date Time Provider Department Center  10/22/2023 10:30 AM Jewell Haught, Archie Patten, MD [facility] None  11/17/2023  8:00 AM [facility] LAB [facility] None  11/17/2023  8:30 AM Cristal Howatt, MD [facility] None  11/18/2023  7:30 AM [facility] MM 3 [facility]      This document was completed utilizing speech recognition software. Grammatical errors, random word insertions, pronoun errors, and incomplete sentences are an  occasional consequence of this system due to software limitations, ambient noise, and hardware issues. Any formal questions or concerns about the content, text or information contained within the body of this dictation should be directly addressed to the provider for clarification.

## 2023-10-15 NOTE — Assessment & Plan Note (Signed)
 Coronary artery disease with high CT-calcium score. No myocardial infarctions or stents. Currently on statin therapy and baby aspirin to minimize risk of myocardial infarction. - Continue statin therapy - Continue baby aspirin once daily

## 2023-10-15 NOTE — Assessment & Plan Note (Addendum)
 Chronic thrombocytosis with platelet counts ranging from 551,000 to 685,000 since August 2024.   Differential diagnosis includes essential thrombocytosis (ET), reactive thrombocytosis due to stress, infection, iron deficiency, or thyroid abnormalities. No history of blood clots, splenomegaly, or significant infections recently.   Labs today showed persistent thrombocytosis but platelet count is better at 588,000.  White count 8800 with normal differential.  Hemoglobin normal at 12.2, MCV 91.2.  CMP unremarkable.  LDH, sed rate, CRP are all within normal limits.  Iron studies showed no evidence of iron deficiency.  Ferritin is borderline low at 11.  We will pursue JAK2 mutation analysis with reflex testing to include CALR, MPL, exon 12-15 mutations to rule out primary myeloproliferative neoplasm.  Discussed the potential need for a bone marrow biopsy if JAK2 or other mutations are positive. If confirmed as ET, treatment with hydroxyurea may be initiated with goal to keep platelet count below 600,000 and prevent complications such as thrombosis, stroke, or cardiovascular events. Hydroxyurea is not started before a bone marrow biopsy to avoid affecting the findings. The risk of transformation to acute myeloid leukemia (AML) is a concern, and treatment aims to prevent this progression.  She has been taking baby aspirin regularly, which may help reduce clotting risk.  She was advised to continue the same.  - Consider increasing aspirin dosage if traveling, depending on test results.  I will discuss results of remaining workup over the phone with her next week.  Patient will be traveling out of country from 10/28/2023 until 11/14/2023.  I will see her in clinic, once she returns.

## 2023-10-15 NOTE — Assessment & Plan Note (Signed)
 She was recently started on Fosamax from July 2024.  Tolerating this well.

## 2023-10-22 ENCOUNTER — Inpatient Hospital Stay (HOSPITAL_BASED_OUTPATIENT_CLINIC_OR_DEPARTMENT_OTHER): Admitting: Oncology

## 2023-10-22 ENCOUNTER — Encounter: Payer: Self-pay | Admitting: Oncology

## 2023-10-22 DIAGNOSIS — D75839 Thrombocytosis, unspecified: Secondary | ICD-10-CM | POA: Diagnosis not present

## 2023-10-22 LAB — BCR-ABL1 FISH
Cells Analyzed: 200
Cells Counted: 200

## 2023-10-22 NOTE — Progress Notes (Signed)
 [facility]  HEMATOLOGY-ONCOLOGY ELECTRONIC VISIT PROGRESS NOTE  PATIENT NAME: Yolanda Nicholson   MR#: [id number] DOB: May 10, 1946  DATE OF SERVICE: 10/22/2023  Patient Care Team: Cleatis Polka., MD as PCP - General (Internal Medicine)  I connected with the patient via telephone conference and verified that I am speaking with the correct person using two identifiers. The patient's location is at home and I am providing care from the [facility].  I discussed the limitations, risks, security and privacy concerns of performing an evaluation and management service by e-visits and the availability of in person appointments.  I also discussed with the patient that there may be a patient responsible charge related to this service. The patient expressed understanding and agreed to proceed.   ASSESSMENT & PLAN:   Yolanda Nicholson is a 78 y.o.  lady with a past medical history of noncritical coronary artery disease, osteoporosis, diverticulitis, dyslipidemia, macular degeneration, was referred to our service in March 2025 for evaluation of thrombocytosis.   Thrombocytosis Chronic thrombocytosis with platelet counts ranging from 551,000 to 685,000 since August 2024.    Differential diagnosis includes essential thrombocytosis (ET), reactive thrombocytosis due to stress, infection, iron deficiency, or thyroid abnormalities.    On her consultation with [place] on 10/15/2023, labs showed persistent thrombocytosis but platelet count was slightly better at 588,000.  White count 8800 with normal differential.  Hemoglobin normal at 12.2, MCV 91.2.  CMP unremarkable.  LDH, Sed rate, CRP were all within normal limits.  Iron studies showed no evidence of iron deficiency.  Ferritin is borderline low at 11.   We pursued JAK2 mutation analysis with reflex testing to include CALR, MPL, exon 12-15 mutations to rule out primary myeloproliferative neoplasm.  We also checked BCR/ABL 1.   These results are pending.   Discussed the potential need for a bone marrow biopsy if JAK2 or other mutations are positive. If confirmed as ET, treatment with hydroxyurea may be initiated with goal to keep platelet count below 600,000 and prevent complications such as thrombosis, stroke, or cardiovascular events. Hydroxyurea is not started before a bone marrow biopsy to avoid affecting the findings. The risk of transformation to acute myeloid leukemia (AML) is a concern, and treatment aims to prevent this progression.   She has been taking baby aspirin regularly, which may help reduce clotting risk.  She was advised to continue the same.   Consider increasing aspirin dosage if traveling, depending on test results.   I will discuss results of remaining workup over the phone with her next week.   Patient will be traveling out of country from 10/28/2023 until 11/14/2023.  I will see her in clinic, once she returns.   I discussed the assessment and treatment plan with the patient. The patient was provided an opportunity to ask questions and all were answered. The patient agreed with the plan and demonstrated an understanding of the instructions. The patient was advised to call back or seek an in-person evaluation if the symptoms worsen or if the condition fails to improve as anticipated.    I spent 76 minutes over the phone with the patient reviewing test results, discuss management and coordination/planning of care.  Meryl Crutch, MD 10/22/2023 11:18 AM [facility] - A DE[facility] [address] [place] [address] Dept: [phone] Dept Fax: [phone]   INTERVAL HISTORY:  Please see above for problem oriented charting.  The purpose of today's discussion is to explain recent lab results and to formulate plan of care.  She reports no new complaints compared to last visit.  She is at her baseline health  status.  SUMMARY OF HEMATOLOGY HISTORY:  Routine labs at her PCPs office on 08/30/2023 showed platelet count of 682,000.  White count was 7800 with normal differential, hemoglobin 12.1.  Previously labs in September 2024 showed platelet count of 551,000 and labs in August 2024 showed platelet count of 685,000.  Given persistent thrombocytosis, referral was sent to [place] for further evaluation.   In August 2024, the patient experienced a gastrointestinal illness that she suspected was C. diff, but tests were negative. This illness was followed by a bout of diverticulitis, a condition the patient has experienced three times over the past few years. Since this time, the patient has reported a lack of energy and fatigue, which is unusual for her.   The patient is currently on cholesterol medication and takes aspirin regularly.   She did not have prior splenectomy. She had no prior history or diagnosis of cancer. Her age appropriate screening programs are up-to-date. Patient has never been diagnosed with thrombotic events.  Chronic thrombocytosis with platelet counts ranging from 551,000 to 685,000 since August 2024.    Differential diagnosis includes essential thrombocytosis (ET), reactive thrombocytosis due to stress, infection, iron deficiency, or thyroid abnormalities.    On her consultation with [place] on 10/15/2023, labs showed persistent thrombocytosis but platelet count was slightly better at 588,000.  White count 8800 with normal differential.  Hemoglobin normal at 12.2, MCV 91.2.  CMP unremarkable.  LDH, Sed rate, CRP were all within normal limits.  Iron studies showed no evidence of iron deficiency.  Ferritin is borderline low at 11.   We pursued JAK2 mutation analysis with reflex testing to include CALR, MPL, exon 12-15 mutations to rule out primary myeloproliferative neoplasm.  We also checked BCR/ABL 1.  These results are pending.   Discussed the potential need for a bone marrow biopsy if JAK2 or  other mutations are positive. If confirmed as ET, treatment with hydroxyurea may be initiated with goal to keep platelet count below 600,000 and prevent complications such as thrombosis, stroke, or cardiovascular events. Hydroxyurea is not started before a bone marrow biopsy to avoid affecting the findings. The risk of transformation to acute myeloid leukemia (AML) is a concern, and treatment aims to prevent this progression.   She has been taking baby aspirin regularly, which may help reduce clotting risk.  She was advised to continue the same.   Consider increasing aspirin dosage if traveling, depending on test results.   I will discuss results of remaining workup over the phone with her next week.   Patient will be traveling out of country from 10/28/2023 until 11/14/2023.  I will see her in clinic, once she returns.  REVIEW OF SYSTEMS:    Review of Systems - Oncology  All other pertinent systems were reviewed with the patient and are negative.  I have reviewed the past medical history, past surgical history, social history and family history with the patient and they are unchanged from previous note.  ALLERGIES:  She is allergic to betadine [povidone iodine], penicillins, and sulfa antibiotics.  MEDICATIONS:  Current Outpatient Medications  Medication Sig Dispense Refill   alendronate (FOSAMAX) 70 MG tablet Take 70 mg by mouth once a week.     Cholecalciferol (VITAMIN D) 2000 UNITS tablet Take 2,000 Units by mouth daily.  ezetimibe (ZETIA) 10 MG tablet Take 10 mg by mouth daily.     Multiple Vitamins-Minerals (PRESERVISION AREDS 2 PO) Take by mouth.     Propylene Glycol (SYSTANE BALANCE OP) Apply to eye.     rosuvastatin (CRESTOR) 20 MG tablet Take 20 mg by mouth daily.     tobramycin (TOBREX) 0.3 % ophthalmic solution  (Patient not taking: Reported on 10/15/2023)     No current facility-administered medications for this visit.    PHYSICAL EXAMINATION:   Onc Performance Status  - 10/22/23 1000       ECOG Perf Status   ECOG Perf Status Fully active, able to carry on all pre-disease performance without restriction      KPS SCALE   KPS % SCORE Normal, no compliants, no evidence of disease             LABORATORY DATA:   I have reviewed the data as listed.  Recent Results (from the past 2160 hours)  Ferritin     Status: None   Collection Time: 10/15/23 10:46 AM  Result Value Ref Range   Ferritin 11 11 - 307 ng/mL    Comment: Performed at [facility], [address], [place], [place] [address]  Iron and Iron Binding Capacity ([facility] only)     Status: None   Collection Time: 10/15/23 10:46 AM  Result Value Ref Range   Iron 77 28 - 170 ug/dL   TIBC 604 540 - 981 ug/dL   Saturation Ratios 18 10.4 - 31.8 %   UIBC 363 148 - 442 ug/dL    Comment: Performed at [facility], [address], [place], [place] [address]  C-reactive protein     Status: None   Collection Time: 10/15/23 10:46 AM  Result Value Ref Range   CRP 0.6 <1.0 mg/dL    Comment: Performed at [facility], [address], [place], [place] [address]  Sedimentation rate     Status: None   Collection Time: 10/15/23 10:46 AM  Result Value Ref Range   Sed Rate 9 0 - 22 mm/hr    Comment: Performed at [facility], [address] [place], [place] [address]  Lactate dehydrogenase     Status: None   Collection Time: 10/15/23 10:46 AM  Result Value Ref Range   LDH 156 98 - 192 U/L    Comment: Performed at [facility], [address] [place], [place] [address]  CMP (Cancer Center only)     Status: Abnormal   Collection Time: 10/15/23 10:46 AM  Result Value Ref Range   Sodium 139 135 - 145 mmol/L   Potassium 3.8 3.5 - 5.1 mmol/L   Chloride 106 98 - 111 mmol/L   CO2 28 22 - 32 mmol/L   Glucose, Bld 101 (H) 70 - 99 mg/dL    Comment: Glucose reference range applies only to samples taken after  fasting for at least 8 hours.   BUN 15 8 - 23 mg/dL   Creatinine 6.96 2.95 - 1.00 mg/dL   Calcium 9.3 8.9 - 28.4 mg/dL   Total Protein 7.3 6.5 - 8.1 g/dL   Albumin 4.5 3.5 - 5.0 g/dL   AST 40 15 - 41 U/L   ALT 37 0 - 44 U/L   Alkaline Phosphatase 55 38 - 126 U/L   Total Bilirubin 0.6 0.0 - 1.2 mg/dL   GFR, Estimated >13 >24 mL/min    Comment: (NOTE) Calculated  using the CKD-EPI Creatinine Equation (2021)    Anion gap 5 5 - 15    Comment: Performed at [facility], [address] [place], [place] [address]  CBC with Differential (Cancer Center Only)     Status: Abnormal   Collection Time: 10/15/23 10:46 AM  Result Value Ref Range   WBC Count 8.8 4.0 - 10.5 K/uL   RBC 4.19 3.87 - 5.11 MIL/uL   Hemoglobin 12.2 12.0 - 15.0 g/dL   HCT 63.0 16.0 - 10.9 %   MCV 91.2 80.0 - 100.0 fL   MCH 29.1 26.0 - 34.0 pg   MCHC 31.9 30.0 - 36.0 g/dL   RDW 32.3 55.7 - 32.2 %   Platelet Count 588 (H) 150 - 400 K/uL   nRBC 0.0 0.0 - 0.2 %   Neutrophils Relative % 75 %   Neutro Abs 6.5 1.7 - 7.7 K/uL   Lymphocytes Relative 13 %   Lymphs Abs 1.2 0.7 - 4.0 K/uL   Monocytes Relative 10 %   Monocytes Absolute 0.9 0.1 - 1.0 K/uL   Eosinophils Relative 1 %   Eosinophils Absolute 0.1 0.0 - 0.5 K/uL   Basophils Relative 1 %   Basophils Absolute 0.0 0.0 - 0.1 K/uL   Immature Granulocytes 0 %   Abs Immature Granulocytes 0.03 0.00 - 0.07 K/uL    Comment: Performed at [facility], [address] [place], [place] [address]     RADIOGRAPHIC STUDIES:  No recent pertinent imaging studies available to review.  No orders of the defined types were placed in this encounter.    Future Appointments  Date Time Provider Department Center  10/26/2023  1:00 PM Valynn Schamberger, MD [facility] None  11/17/2023  8:00 AM [facility] None  11/17/2023  8:30 AM Yarianna Varble, MD [facility] None  11/18/2023  7:30 AM [facility] MM 3 [facility]     This document was completed utilizing speech recognition software. Grammatical errors, random word insertions, pronoun errors, and incomplete sentences are an occasional consequence of this system due to software limitations, ambient noise, and hardware issues. Any formal questions or concerns about the content, text or information contained within the body of this dictation should be directly addressed to the provider for clarification.

## 2023-10-22 NOTE — Assessment & Plan Note (Signed)
 Chronic thrombocytosis with platelet counts ranging from 551,000 to 685,000 since August 2024.    Differential diagnosis includes essential thrombocytosis (ET), reactive thrombocytosis due to stress, infection, iron deficiency, or thyroid abnormalities.    On her consultation with [place] on 10/15/2023, labs showed persistent thrombocytosis but platelet count was slightly better at 588,000.  White count 8800 with normal differential.  Hemoglobin normal at 12.2, MCV 91.2.  CMP unremarkable.  LDH, Sed rate, CRP were all within normal limits.  Iron studies showed no evidence of iron deficiency.  Ferritin is borderline low at 11.   We pursued JAK2 mutation analysis with reflex testing to include CALR, MPL, exon 12-15 mutations to rule out primary myeloproliferative neoplasm.  We also checked BCR/ABL 1.  These results are pending.   Discussed the potential need for a bone marrow biopsy if JAK2 or other mutations are positive. If confirmed as ET, treatment with hydroxyurea may be initiated with goal to keep platelet count below 600,000 and prevent complications such as thrombosis, stroke, or cardiovascular events. Hydroxyurea is not started before a bone marrow biopsy to avoid affecting the findings. The risk of transformation to acute myeloid leukemia (AML) is a concern, and treatment aims to prevent this progression.   She has been taking baby aspirin regularly, which may help reduce clotting risk.  She was advised to continue the same.   Consider increasing aspirin dosage if traveling, depending on test results.   I will discuss results of remaining workup over the phone with her next week.   Patient will be traveling out of country from 10/28/2023 until 11/14/2023.  I will see her in clinic, once she returns.

## 2023-10-25 LAB — JAK2 V617F RFX CALR/MPL/E12-15

## 2023-10-25 LAB — CALR +MPL + E12-E15  (REFLEX): MPL %: 3.14 %

## 2023-10-26 ENCOUNTER — Inpatient Hospital Stay: Attending: Oncology | Admitting: Oncology

## 2023-10-26 ENCOUNTER — Encounter: Payer: Self-pay | Admitting: Oncology

## 2023-10-26 DIAGNOSIS — Z1589 Genetic susceptibility to other disease: Secondary | ICD-10-CM

## 2023-10-26 DIAGNOSIS — D75839 Thrombocytosis, unspecified: Secondary | ICD-10-CM | POA: Diagnosis not present

## 2023-10-26 NOTE — Progress Notes (Signed)
 [facility]  HEMATOLOGY-ONCOLOGY ELECTRONIC VISIT PROGRESS NOTE  PATIENT NAME: Yolanda Nicholson   MR#: [id number] DOB: 1945-10-01  DATE OF SERVICE: 10/26/2023  Patient Care Team: Cleatis Polka., MD as PCP - General (Internal Medicine)  I connected with the patient via telephone conference and verified that I am speaking with the correct person using two identifiers. The patient's location is at home and I am providing care from the [facility].  I discussed the limitations, risks, security and privacy concerns of performing an evaluation and management service by e-visits and the availability of in person appointments.  I also discussed with the patient that there may be a patient responsible charge related to this service. The patient expressed understanding and agreed to proceed.   ASSESSMENT & PLAN:   Yolanda Nicholson is a 78 y.o.  lady with a past medical history of noncritical coronary artery disease, osteoporosis, diverticulitis, dyslipidemia, macular degeneration, was referred to our service in March 2025 for evaluation of thrombocytosis.   Thrombocytosis Chronic thrombocytosis with platelet counts ranging from 551,000 to 685,000 since August 2024.    Differential diagnosis includes essential thrombocytosis (ET), reactive thrombocytosis due to stress, infection, iron deficiency, or thyroid abnormalities.    On her consultation with [place] on 10/15/2023, labs showed persistent thrombocytosis but platelet count was slightly better at 588,000.  White count 8800 with normal differential.  Hemoglobin normal at 12.2, MCV 91.2.  CMP unremarkable.  LDH, Sed rate, CRP were all within normal limits.  Iron studies showed no evidence of iron deficiency.  Ferritin is borderline low at 11.   We pursued JAK2 mutation analysis with reflex testing to include CALR, MPL, exon 12-15 mutations to rule out primary myeloproliferative neoplasm.  JAK2 mutation was negative.   However reflex testing showed that she has MPL mutation positivity.  Clinical picture is consistent with essential thrombocytosis.  We will obtain bone marrow biopsy/aspiration for further confirmation.  Request submitted today.  Since patient will be traveling out of country from 10/28/2023 until 11/14/2023, bone marrow biopsy will have to be performed once she returns to country.   We also checked BCR/ABL 1 and it was negative.   If confirmed as ET, treatment with hydroxyurea may be initiated with goal to keep platelet count below 600,000 and prevent complications such as thrombosis, stroke, or cardiovascular events. Hydroxyurea is not started before a bone marrow biopsy to avoid affecting the findings. The risk of transformation to acute myeloid leukemia (AML) is a concern, and treatment aims to prevent this progression.   She has been taking aspirin 162 mg daily regularly, which may help reduce clotting risk.  She was advised to continue the same.  She was advised to maintain activity and hydration during her trip to prevent thrombosis.     Patient will be traveling out of country from 10/28/2023 until 11/14/2023.  Bone marrow biopsy will be performed by IR, once she returns.  I will see her in clinic approximately 2 weeks later with results to discuss plan of care.    I discussed the assessment and treatment plan with the patient. The patient was provided an opportunity to ask questions and all were answered. The patient agreed with the plan and demonstrated an understanding of the instructions. The patient was advised to call back or seek an in-person evaluation if the symptoms worsen or if the condition fails to improve as anticipated.    I spent 11 minutes over the phone with the  patient reviewing test results, discuss management and coordination/planning of care.  Meryl Crutch, MD 10/26/2023 1:58 PM [facility] - A DEPT OF [facility] [address] [place] [address] Dept: [phone] Dept Fax: [phone]   INTERVAL HISTORY:  Please see above for problem oriented charting.  The purpose of today's discussion is to explain recent lab results and to formulate plan of care.  She reports no new complaints compared to last visit.  She is at her baseline health status. Yolanda Nicholson is currently taking two baby aspirin daily, which she tolerates well.  SUMMARY OF HEMATOLOGY HISTORY:  Routine labs at her PCPs office on 08/30/2023 showed platelet count of 682,000.  White count was 7800 with normal differential, hemoglobin 12.1.  Previously labs in September 2024 showed platelet count of 551,000 and labs in August 2024 showed platelet count of 685,000.  Given persistent thrombocytosis, referral was sent to [place] for further evaluation.   In August 2024, the patient experienced a gastrointestinal illness that she suspected was C. diff, but tests were negative. This illness was followed by a bout of diverticulitis, a condition the patient has experienced three times over the past few years. Since this time, the patient has reported a lack of energy and fatigue, which is unusual for her.   The patient is currently on cholesterol medication and takes aspirin regularly.   She did not have prior splenectomy. She had no prior history or diagnosis of cancer. Her age appropriate screening programs are up-to-date. Patient has never been diagnosed with thrombotic events.  Chronic thrombocytosis with platelet counts ranging from 551,000 to 685,000 since August 2024.    Differential diagnosis includes essential thrombocytosis (ET), reactive thrombocytosis due to stress, infection, iron deficiency, or thyroid abnormalities.    On her consultation with [place] on 10/15/2023, labs showed persistent thrombocytosis but platelet count was slightly better at 588,000.  White count 8800 with normal differential.  Hemoglobin normal at 12.2, MCV  91.2.  CMP unremarkable.  LDH, Sed rate, CRP were all within normal limits.  Iron studies showed no evidence of iron deficiency.  Ferritin is borderline low at 11.   We pursued JAK2 mutation analysis with reflex testing to include CALR, MPL, exon 12-15 mutations to rule out primary myeloproliferative neoplasm.  We also checked BCR/ABL 1.  These results are pending.   Discussed the potential need for a bone marrow biopsy if JAK2 or other mutations are positive. If confirmed as ET, treatment with hydroxyurea may be initiated with goal to keep platelet count below 600,000 and prevent complications such as thrombosis, stroke, or cardiovascular events. Hydroxyurea is not started before a bone marrow biopsy to avoid affecting the findings. The risk of transformation to acute myeloid leukemia (AML) is a concern, and treatment aims to prevent this progression.   She has been taking baby aspirin regularly, which may help reduce clotting risk.  She was advised to continue the same.   Consider increasing aspirin dosage if traveling, depending on test results.   I will discuss results of remaining workup over the phone with her next week.   Patient will be traveling out of country from 10/28/2023 until 11/14/2023.  I will see her in clinic, once she returns.  REVIEW OF SYSTEMS:    Review of Systems - Oncology  All other pertinent systems were reviewed with the patient and are negative.  I have reviewed the past medical history, past surgical history,  social history and family history with the patient and they are unchanged from previous note.  ALLERGIES:  She is allergic to betadine [povidone iodine], penicillins, and sulfa antibiotics.  MEDICATIONS:  Current Outpatient Medications  Medication Sig Dispense Refill   alendronate (FOSAMAX) 70 MG tablet Take 70 mg by mouth once a week.     Cholecalciferol (VITAMIN D) 2000 UNITS tablet Take 2,000 Units by mouth daily.      ezetimibe (ZETIA) 10 MG tablet  Take 10 mg by mouth daily.     Multiple Vitamins-Minerals (PRESERVISION AREDS 2 PO) Take by mouth.     Propylene Glycol (SYSTANE BALANCE OP) Apply to eye.     rosuvastatin (CRESTOR) 20 MG tablet Take 20 mg by mouth daily.     tobramycin (TOBREX) 0.3 % ophthalmic solution  (Patient not taking: Reported on 10/15/2023)     No current facility-administered medications for this visit.    PHYSICAL EXAMINATION:   Onc Performance Status - 10/26/23 1300       ECOG Perf Status   ECOG Perf Status Fully active, able to carry on all pre-disease performance without restriction      KPS SCALE   KPS % SCORE Normal, no compliants, no evidence of disease             LABORATORY DATA:   I have reviewed the data as listed.  Recent Results (from the past 2160 hours)  BCR-ABL1 FISH     Status: None   Collection Time: 10/15/23 10:45 AM  Result Value Ref Range   Specimen Type BLOOD    Cells Counted 200    Cells Analyzed 200    FISH Result ABL1 GENE FUSION     Comment: Comment: NO BCR    Interpretation Comment:     Comment: (NOTE) NEGATIVE             nuc ish 9q34(ASS1,ABL1)x2,22q11.2(BCRx2)[200].      The fluorescence in situ hybridization (FISH) study was normal. FISH, using unique sequence DNA probes for the ABL1 and BCR gene regions showed two ABL1 signals (red), two control ASS1 gene signals (aqua) located adjacent to the ABL1 locus at 9q34, and two BCR signals (green) at 22q11.2 in all interphase nuclei examined. There was NO evidence of CML or ALL-associated BCR::ABL1 dual fusion signals in this analysis. .      This analysis is limited to abnormalities detectable by the specific probes included in the study. FISH results should be interpreted within the context of a full cytogenetic analysis and pathology evaluation.  A BCR::ABL1 gene fusion in greater than 3 interphase nuclei in a patient with a new clinical diagnosis is considered positive. The DNA probe vendor for this  study was [employer], community). .      This test was developed and its performance characteristics d etermined by [employer] ([facility]). It has not been cleared or approved by the U.S. Food and Drug Administration.    Director Review: Comment:     Comment: (NOTE) Albertina Senegal. Christell Constant, PhD, [facility] Performed At: [facility] [address] [place], [place] [id number] Maurine Simmering MDPhD [id number]   JAK2 V617F rfx CALR/MPL/E12-15     Status: None   Collection Time: 10/15/23 10:46 AM  Result Value Ref Range   Specimen Type Comment:     Comment: NOT PROVIDED   JAK2 V617F Result Comment     Comment: (NOTE) NEGATIVE The JAK2 V617F mutation is not detected in the provided  specimen of this individual. Results should be interpreted in conjunction with clinical and other laboratory findings for the most accurate interpretation. This test was developed and its performance characteristics determined by [facility]. It has not been cleared or approved by the Food and Drug Administration.    Reflex Comment     Comment: (NOTE) Reflex to CALR Mutation Analysis, JAK2 Exon 12-15 Mutation Analysis, and MPL Mutation Analysis is indicated.    V617F Rfx CALR/MPL/E12-15 Bkgd Comment     Comment: (NOTE)    Molecular testing of blood or bone marrow is useful in the evaluation of suspected myeloproliferative neoplasms (MPN). Mutations in the JAK2, MPL, and CALR genes are present in virtually all MPNs and their presence help distinguish benign reactive processes from clonal neoplasms. These mutations are generally considered mutually exclusive, although concurrent clones have been reported in rare patients. This test will assess for the JAK2V617F (exon 14) mutation first and will reflex to CALR mutation analysis, MPL mutation analysis, and JAK2 exon 12 to 15 mutation analysis if the JAK2V617F mutation is negative.    The JAK2 (Janus kinase 2) gene encodes for  a non-receptor protein tyrosine kinase that activates cytokine and growth factor signaling. The V617F (c.1849 G>T) mutation results in constitutive activation of JAK2 and downstream STAT5 and ERK signaling. The V617F mutation is observed in approximately 95% of polycythemia vera (PV), 60% of essential thromboc ythemia (ET) and primary myelofibrosis (PMF). It is also infrequently present (3-5%) in myelodysplastic syndrome, chronic myelomonocytic leukemia, and other atypical chronic myeloid disorders. A small percentage of JAK2 mutation positive patients (3.3%) contain other non-V617F mutations within exons 12 to 15. In particular, mutations in exon 12 of JAK2 have been described in approximately 3% of patients with PV. JAK2 allele burden correlates with clinical phenotype, with low levels of mutant allele characterized by thrombocytosis, intermediate levels with erythrocytosis, and high mutant allele burden correlating with enhanced myelopoiesis of the BM, leukocytosis, increasing spleen size, and circulating CD34-positive cells.    The CALR (Calreticulin) gene encodes for a multifunctional calcium-binding protein involved in many cellular activities such as growth, proliferation, adhesion, and programmed cell death. Among patients with JAK2 negative MPNs, CALR are found in a pproximately 70% of patients with JAK2-negative essential thrombocythemia (ET) and 60-88% of patients with JAK2-negative primary myelofibrosis(PMF). Only a minority of patients (approximately 8%) with myelodysplasia have mutations in the CALR gene. CALR mutations are rarely detected in patients with de novo acute myeloid leukemia, chronic myelogenous leukemia, lymphoid leukemia, or solid tumors. CALR mutations are not detected in polycythemia and generally appear to be mutually exclusive with JAK2 mutations and MPL mutations. The majority of mutational changes involve a variety of insertion deletion mutations in  exon 9 of the calreticulin gene: approximately 53% of all CALR mutations are a 52 bp deletion (type-1) while the second most prevalent mutation (approximately 32%) contains a 5 bp insertion (type-2). Other mutations (non-type 1 or type 2) are seen in a small minority of cases. CALR mutations in PMF tend to be with a favorable prognosis compared to JAK2 V617F [employer], whereas primary myelofibrosis negative for CALR, JAK2 V617F and MPL mutations (so-called triple negative) is associated with a poor prognosis and shorter survival.    The MPL (myeloproliferative leukemia virus oncogene) gene encodes the thrombopoietin receptor which regulates hematopoiesis and megakaryopoiesis. Activating MPL mutations are associated with a subset of myeloproliferative neoplasms and acute megakaryoblastic leukemia. MPL W515 mutations are present in approximately 5-8% of patients with primary myelofibrosis (PMF) and  1-4% of patients with essential thrombocythemia (ET). The S505 mutation is detected in patients with hereditary thrombocythemia.    Limitations    This assay has a sensitivity of approximately 1% VAF for JAK2 V617F, 2.5% VAF for other mutations in JAK2 exons 12 to 15, CALR mutations, and MPL mutations. Deletions in JAK2 up to 6 bp and insertions up to 34 bp have been detected in validation studies. Deletions in CALR up to 70 bp and i nsertions up to 12 bp have been detected in validation studies.    Method based next generation sequencing.     Comment: Comment [employer]    References Comment     Comment: (NOTE) Alghasham N, Alnouri Y, Abalkhail H, Clarita Leber. Detection of mutations in JAK2 exons 12-15 by [employer] sequencing. Int J Lab Hematol. 2016 Feb;38(1):34-41. doi: 10.1111/ijlh.65784. Epub 2015 Sep 11. PMID: 69629528. Pura Spice, Unk Lightning, Hasserjian R, Patrica Duel, Borowitz MJ, Leroy Libman MM, [place] CD, Sterling, Vardiman JW. The 2016 revision to the World Health Organization  classification of myeloid neoplasms and acute leukemia. Blood. 2016 May 19;127(20):2391-405. doi: 10.1182/blood-2016-03-643544. Epub 2016 Apr 11. PMID: 41324401. Genevie Ann [facility], Zhang ZJ, Mason S, Albitar M. Mutation profile of JAK2 transcripts in patients with chronic myeloproliferative neoplasias. J Mol Diagn. 2009 Jan;11(1):49-53.doi: 10.2353/jmoldx.2009.080114. Epub 2008 Dec 12. PMID: 02725366; PMCID: YQI3474259. NCCN Clinical Practice Guidelines in Oncology (NCCN Guidelines) Myeloproliferative Neoplasms Version 3.2022 - March 06, 2021. Swerdlow SH, Programmer, multimedia. WHO classif ication of Tumours of Haematopoietic and Lymphoid Tissues. 4th edn. Jaci Standard, [place]: Geologist, engineering for [employer] on [employer]; 2017. Tefferi A. Primary myelofibrosis: 2021 update on diagnosis, risk-stratification and management. Am J Hematol. 2021 Jan;96(1):145-162. doi: 10.1002/ajh.26050. Epub 2020 Dec 2. PMID: 56387564. Royetta Car, Kralovics R. Genetic basis and molecular pathophysiology of classical myeloproliferative neoplasms. Blood. 2017 Feb 9;129(6):667-679. doi: 10.1182/blood-2016-10-695940. Epub 2016 Dec 27. PMID: 33295188.    Director Review Comment     Comment: (NOTE) Technical Component performed at [employer] [facility] Professional Component performed by: Mellody Life, PhD, [facility] Director, Molecular Oncology [facility] [id number], [address] [place] [address] [phone] Performed At: [facility] [address] [place], [place] [id number] Maurine Simmering MDPhD [id number] Performed At: [facility] [address] [place], [place] [id number] Maurine Simmering MDPhD YI:[id number]   Ferritin     Status: None   Collection Time: 10/15/23 10:46 AM  Result Value Ref Range   Ferritin 11 11 - 307 ng/mL    Comment: Performed at [facility], [address], [place], [place] [address]  Iron and Iron Binding Capacity (CC-[facility],HP only)      Status: None   Collection Time: 10/15/23 10:46 AM  Result Value Ref Range   Iron 77 28 - 170 ug/dL   TIBC 818 299 - 371 ug/dL   Saturation Ratios 18 10.4 - 31.8 %   UIBC 363 148 - 442 ug/dL    Comment: Performed at [facility], [address], [place], [place] [address]  C-reactive protein     Status: None   Collection Time: 10/15/23 10:46 AM  Result Value Ref Range   CRP 0.6 <1.0 mg/dL    Comment: Performed at [facility], [address] [place], [place] [address]  Sedimentation rate     Status: None   Collection Time: 10/15/23 10:46 AM  Result Value Ref Range   Sed Rate 9 0 - 22 mm/hr  Comment: Performed at [facility], [address], [place], [place] [address]  Lactate dehydrogenase     Status: None   Collection Time: 10/15/23 10:46 AM  Result Value Ref Range   LDH 156 98 - 192 U/L    Comment: Performed at [facility], [address], [place], [place] [address]  CMP (Cancer Center only)     Status: Abnormal   Collection Time: 10/15/23 10:46 AM  Result Value Ref Range   Sodium 139 135 - 145 mmol/L   Potassium 3.8 3.5 - 5.1 mmol/L   Chloride 106 98 - 111 mmol/L   CO2 28 22 - 32 mmol/L   Glucose, Bld 101 (H) 70 - 99 mg/dL    Comment: Glucose reference range applies only to samples taken after fasting for at least 8 hours.   BUN 15 8 - 23 mg/dL   Creatinine 0.98 1.19 - 1.00 mg/dL   Calcium 9.3 8.9 - 14.7 mg/dL   Total Protein 7.3 6.5 - 8.1 g/dL   Albumin 4.5 3.5 - 5.0 g/dL   AST 40 15 - 41 U/L   ALT 37 0 - 44 U/L   Alkaline Phosphatase 55 38 - 126 U/L   Total Bilirubin 0.6 0.0 - 1.2 mg/dL   GFR, Estimated >82 >95 mL/min    Comment: (NOTE) Calculated using the CKD-EPI Creatinine Equation (2021)    Anion gap 5 5 - 15    Comment: Performed at [facility], [address] [place], [place] [address]  CBC with Differential (Cancer Center Only)     Status: Abnormal    Collection Time: 10/15/23 10:46 AM  Result Value Ref Range   WBC Count 8.8 4.0 - 10.5 K/uL   RBC 4.19 3.87 - 5.11 MIL/uL   Hemoglobin 12.2 12.0 - 15.0 g/dL   HCT 86.5 78.4 - 69.6 %   MCV 91.2 80.0 - 100.0 fL   MCH 29.1 26.0 - 34.0 pg   MCHC 31.9 30.0 - 36.0 g/dL   RDW 29.5 28.4 - 13.2 %   Platelet Count 588 (H) 150 - 400 K/uL   nRBC 0.0 0.0 - 0.2 %   Neutrophils Relative % 75 %   Neutro Abs 6.5 1.7 - 7.7 K/uL   Lymphocytes Relative 13 %   Lymphs Abs 1.2 0.7 - 4.0 K/uL   Monocytes Relative 10 %   Monocytes Absolute 0.9 0.1 - 1.0 K/uL   Eosinophils Relative 1 %   Eosinophils Absolute 0.1 0.0 - 0.5 K/uL   Basophils Relative 1 %   Basophils Absolute 0.0 0.0 - 0.1 K/uL   Immature Granulocytes 0 %   Abs Immature Granulocytes 0.03 0.00 - 0.07 K/uL    Comment: Performed at [facility], [address], [place], [place] [address]  CALR +MPL + E12-E15 (reflexed)     Status: None   Collection Time: 10/15/23 10:46 AM  Result Value Ref Range   CALR Result Comment     Comment: (NOTE) NEGATIVE No insertions or deletions were detected within the analyzed region of the calreticulin (CALR) gene. A negative result does not entirely exclude the possibility of a clonal population carrying CALR gene mutations that are not covered by this assay. Results should be interpreted in conjunction with clinical and laboratory findings for the most accurate interpretation.    MPL Result Comment     Comment: (NOTE) POSITIVE MPL mutation was identified in the provided specimen of this individual.  Results should be interpreted in conjunction with clinical and other laboratory findings for the most accurate interpretation.    MPL % 3.14 %   MPL Nucleotide Comment:     Comment: c.1544G>T   MPL Amino Acid Comment:     Comment: [email or web address]   E12-15 Result Comment     Comment: (NOTE) NEGATIVE    JAK2 mutations were not detected in exons 12, 13, 14 and 15. The G to T  nucleotide change encoding the V617F mutation was not detected. This result does not rule out the presence of JAK2 mutation at a level below the detection sensitivity of this assay, the presence of other mutations outside the analyzed region of the JAK2 gene, or the presence of a myeloproliferative or other neoplasm. Result must be correlated with other clinical data for the most accurate diagnosis. Performed At: [facility] RT[facility] [address] [place], [place] [id number] Maurine Simmering MDPhD WU:[id number]      RADIOGRAPHIC STUDIES:  No recent pertinent imaging studies available to review.  Orders Placed This Encounter  Procedures   CT BONE MARROW BIOPSY & ASPIRATION    Standing Status:   Future    Expected Date:   11/16/2023    Expiration Date:   10/25/2024    Scheduling Instructions:     Patient will be traveling out of the country from 10/28/2023 until 11/14/2023.  If you cannot reach her prior to 4/3, please call her back on 11/15/2023.  She is okay with getting the procedure done around 4/22 or 11/17/2023.    Reason for Exam (SYMPTOM  OR DIAGNOSIS REQUIRED):   MPL mutation positive. Concern for Essential thrombocytosis.  Please arrange for bone marrow biopsy/aspiration.    Preferred location?:   [facility]   CBC with Differential (Cancer Center Only)    Standing Status:   Future    Expected Date:   12/08/2023    Expiration Date:   10/25/2024   CMP (Cancer Center only)    Standing Status:   Future    Expected Date:   12/08/2023    Expiration Date:   10/25/2024   Lactate dehydrogenase    Standing Status:   Future    Expected Date:   12/08/2023    Expiration Date:   10/25/2024     Future Appointments  Date Time Provider Department Center  11/17/2023  8:00 AM [facility] LAB [facility] None  11/17/2023  8:30 AM Odis Wickey, MD [facility] None  11/18/2023  7:30 AM GI-BCG MM 3 GI-BCGMM GI-BREAST CE  11/26/2023  9:00 AM [facility]-CT 1 [facility] [place]    This document was  completed utilizing speech recognition software. Grammatical errors, random word insertions, pronoun errors, and incomplete sentences are an occasional consequence of this system due to software limitations, ambient noise, and hardware issues. Any formal questions or concerns about the content, text or information contained within the body of this dictation should be directly addressed to the provider for clarification.

## 2023-10-26 NOTE — Assessment & Plan Note (Addendum)
 Chronic thrombocytosis with platelet counts ranging from 551,000 to 685,000 since August 2024.    Differential diagnosis includes essential thrombocytosis (ET), reactive thrombocytosis due to stress, infection, iron deficiency, or thyroid abnormalities.    On her consultation with [place] on 10/15/2023, labs showed persistent thrombocytosis but platelet count was slightly better at 588,000.  White count 8800 with normal differential.  Hemoglobin normal at 12.2, MCV 91.2.  CMP unremarkable.  LDH, Sed rate, CRP were all within normal limits.  Iron studies showed no evidence of iron deficiency.  Ferritin is borderline low at 11.   We pursued JAK2 mutation analysis with reflex testing to include CALR, MPL, exon 12-15 mutations to rule out primary myeloproliferative neoplasm.  JAK2 mutation was negative.  However reflex testing showed that she has MPL mutation positivity.  Clinical picture is consistent with essential thrombocytosis.  We will obtain bone marrow biopsy/aspiration for further confirmation.  Request submitted today.  Since patient will be traveling out of country from 10/28/2023 until 11/14/2023, bone marrow biopsy will have to be performed once she returns to country.   We also checked BCR/ABL 1 and it was negative.   If confirmed as ET, treatment with hydroxyurea may be initiated with goal to keep platelet count below 600,000 and prevent complications such as thrombosis, stroke, or cardiovascular events. Hydroxyurea is not started before a bone marrow biopsy to avoid affecting the findings. The risk of transformation to acute myeloid leukemia (AML) is a concern, and treatment aims to prevent this progression.   She has been taking aspirin 162 mg daily regularly, which may help reduce clotting risk.  She was advised to continue the same.  She was advised to maintain activity and hydration during her trip to prevent thrombosis.     Patient will be traveling out of country from 10/28/2023 until  11/14/2023.  Bone marrow biopsy will be performed by IR, once she returns.  I will see her in clinic approximately 2 weeks later with results to discuss plan of care.

## 2023-10-27 ENCOUNTER — Telehealth: Payer: Self-pay | Admitting: Oncology

## 2023-10-27 NOTE — Telephone Encounter (Signed)
 Rescheduled patient's appointments Per a message received.   "Patient will be getting bone marrow biopsy, once she returns back to country, around 11/16/23.   Please move her appointment out to 5/14 or later.  She will need labs on return visit.

## 2023-11-17 ENCOUNTER — Ambulatory Visit: Admitting: Oncology

## 2023-11-17 ENCOUNTER — Other Ambulatory Visit

## 2023-11-17 DIAGNOSIS — R58 Hemorrhage, not elsewhere classified: Secondary | ICD-10-CM | POA: Diagnosis not present

## 2023-11-17 DIAGNOSIS — D473 Essential (hemorrhagic) thrombocythemia: Secondary | ICD-10-CM | POA: Diagnosis not present

## 2023-11-17 DIAGNOSIS — R5383 Other fatigue: Secondary | ICD-10-CM | POA: Diagnosis not present

## 2023-11-18 ENCOUNTER — Ambulatory Visit
Admission: RE | Admit: 2023-11-18 | Discharge: 2023-11-18 | Disposition: A | Discharge status: Home / Self Care | Payer: Medicare HMO | Source: Ambulatory Visit | Attending: Internal Medicine | Admitting: Internal Medicine

## 2023-11-18 DIAGNOSIS — Z1231 Encounter for screening mammogram for malignant neoplasm of breast: Secondary | ICD-10-CM | POA: Diagnosis not present

## 2023-11-24 ENCOUNTER — Other Ambulatory Visit: Payer: Self-pay | Admitting: Radiology

## 2023-11-24 DIAGNOSIS — D75839 Thrombocytosis, unspecified: Secondary | ICD-10-CM

## 2023-11-25 ENCOUNTER — Other Ambulatory Visit: Payer: Self-pay | Admitting: Student

## 2023-11-25 ENCOUNTER — Other Ambulatory Visit: Payer: Self-pay | Admitting: Radiology

## 2023-11-25 NOTE — H&P (Signed)
 Chief Complaint: Chronic thrombocytosis , positive MPL mutation, concern for essential thrombocytosis; referred for CT-guided bone marrow biopsy for further confirmation  Referring Provider(s): Pasam,A  Supervising Physician: Fernando Hoyer  Patient Status: [facility] - Out-pt  History of Present Illness: Yolanda Nicholson is a 78 y.o. female ex smoker with past medical history of anemia, colon polyps, diverticulosis, hyperlipidemia, macular degeneration who presents now with chronic thrombocytosis, positive MPL mutation with concern for essential thrombocytosis.  She is scheduled today for CT-guided bone marrow biopsy for further confirmation.   Patient is Full Code  Past Medical History:  Diagnosis Date   Anemia    past hx , not current   Cataract    removed bilat with lens implants   Colon polyps    pt thinks she has had polyps removed at some point in the past   Diverticulitis    Elevated cholesterol    Macular degeneration    Osteopenia    STD (sexually transmitted disease)    HSV1    Past Surgical History:  Procedure Laterality Date   BACK SURGERY     BREAST BIOPSY Right 2020   CATARACT EXTRACTION Bilateral    with lens implants   COLONOSCOPY     COSMETIC SURGERY     eyes & under chin    Allergies: Betadine [povidone iodine], Penicillins, and Sulfa antibiotics  Medications: Prior to Admission medications   Medication Sig Start Date End Date Taking? Authorizing Provider  alendronate (FOSAMAX) 70 MG tablet Take 70 mg by mouth once a week. 05/17/23   [provider]  Cholecalciferol (VITAMIN D) 2000 UNITS tablet Take 2,000 Units by mouth daily.     [provider]  ezetimibe (ZETIA) 10 MG tablet Take 10 mg by mouth daily.    [provider]  Multiple Vitamins-Minerals (PRESERVISION AREDS 2 PO) Take by mouth.    [provider]  Propylene Glycol (SYSTANE BALANCE OP) Apply to eye.    [provider]   rosuvastatin  (CRESTOR ) 20 MG tablet Take 20 mg by mouth daily.    [provider]  tobramycin (TOBREX) 0.3 % ophthalmic solution  03/15/20   [provider]     Family History  Problem Relation Age of Onset   Colon cancer Father    Cancer Father        colon   Hypertension Father    Stroke Father    Colon polyps Neg Hx    Esophageal cancer Neg Hx    Rectal cancer Neg Hx    Stomach cancer Neg Hx     Social History   Socioeconomic History   Marital status: Married    Spouse name: Not on file   Number of children: Not on file   Years of education: Not on file   Highest education level: Not on file  Occupational History   Not on file  Tobacco Use   Smoking status: Former   Smokeless tobacco: Never   Tobacco comments:    in college  Vaping Use   Vaping status: Never Used  Substance and Sexual Activity   Alcohol  use: Yes    Alcohol /week: 4.0 standard drinks of alcohol     Types: 4 Glasses of wine per week   Drug use: No   Sexual activity: Not Currently    Partners: Male    Birth control/protection: Post-menopausal    Comment: husband vasectomy  Other Topics Concern   Not on file  Social History Narrative  Not on file   Social Drivers of Health   Financial Resource Strain: Not on file  Food Insecurity: No Food Insecurity (10/15/2023)   Hunger Vital Sign    Worried About Running Out of Food in the Last Year: Never true    Ran Out of Food in the Last Year: Never true  Transportation Needs: No Transportation Needs (10/15/2023)   PRAPARE - Administrator, Civil Service (Medical): No    Lack of Transportation (Non-Medical): No  Physical Activity: Not on file  Stress: Not on file  Social Connections: Not on file       Review of Systems: Denies fever, headache, chest pain, dyspnea, cough, abdominal pain, nausea, vomiting or bleeding.  She does have occasional back pain.  Vital Signs: Vitals:   11/26/23 0734  BP: (!) 146/85  Pulse:  75  Resp: 15  Temp: 98.2 F (36.8 C)  SpO2: 97%    LMP 07/27/2001   Advance Care Plan: No documents on file     Physical Exam awake, alert.  Chest clear to auscultation bilaterally.  Heart with regular rate and rhythm.  Abdomen soft, positive bowel sounds, nontender.  No lower extremity edema.  Imaging: MM 3D SCREENING MAMMOGRAM BILATERAL BREAST Result Date: 11/22/2023 CLINICAL DATA:  Screening. EXAM: DIGITAL SCREENING BILATERAL MAMMOGRAM WITH TOMOSYNTHESIS AND CAD TECHNIQUE: Bilateral screening digital craniocaudal and mediolateral oblique mammograms were obtained. Bilateral screening digital breast tomosynthesis was performed. The images were evaluated with computer-aided detection. COMPARISON:  Previous exam(s). ACR Breast Density Category b: There are scattered areas of fibroglandular density. FINDINGS: There are no findings suspicious for malignancy. IMPRESSION: No mammographic evidence of malignancy. A result letter of this screening mammogram will be mailed directly to the patient. RECOMMENDATION: Screening mammogram in one year. (Code:SM-B-01Y) BI-RADS CATEGORY  1: Negative. Electronically Signed   By: Dina  Arceo M.D.   On: 11/22/2023 08:45    Labs:  CBC: Recent Labs    10/15/23 1046  WBC 8.8  HGB 12.2  HCT 38.2  PLT 588*    COAGS: No results for input(s): "INR", "APTT" in the last 8760 hours.  BMP: Recent Labs    10/15/23 1046  NA 139  K 3.8  CL 106  CO2 28  GLUCOSE 101*  BUN 15  CALCIUM  9.3  CREATININE 0.54  GFRNONAA >60    LIVER FUNCTION TESTS: Recent Labs    10/15/23 1046  BILITOT 0.6  AST 40  ALT 37  ALKPHOS 55  PROT 7.3  ALBUMIN 4.5    TUMOR MARKERS: No results for input(s): "AFPTM", "CEA", "CA199", "CHROMGRNA" in the last 8760 hours.  Assessment and Plan: 78 y.o. female ex smoker with past medical history of anemia, colon polyps, diverticulosis, hyperlipidemia, macular degeneration who presents now with chronic thrombocytosis,  positive MPL mutation with concern for essential thrombocytosis.  She is scheduled today for CT-guided bone marrow biopsy for further confirmation.Risks and benefits of procedure was discussed with the patient including, but not limited to bleeding, infection, damage to adjacent structures or low yield requiring additional tests.  All of the questions were answered and there is agreement to proceed.  Consent signed and in chart.    Thank you for allowing our service to participate in Yolanda Nicholson 's care.  Electronically Signed: D. Honore Lux, PA-C   11/25/2023, 2:24 PM      I spent a total of  15 Minutes   in face to face in clinical consultation, greater than 50% of which  was counseling/coordinating care for CT-guided bone marrow biopsy

## 2023-11-26 ENCOUNTER — Other Ambulatory Visit: Payer: Self-pay

## 2023-11-26 ENCOUNTER — Ambulatory Visit (HOSPITAL_COMMUNITY)
Admission: RE | Admit: 2023-11-26 | Discharge: 2023-11-26 | Disposition: A | Discharge status: Home / Self Care | Source: Ambulatory Visit | Attending: Oncology | Admitting: Oncology

## 2023-11-26 ENCOUNTER — Ambulatory Visit (HOSPITAL_COMMUNITY)
Admission: RE | Admit: 2023-11-26 | Discharge: 2023-11-26 | Disposition: A | Discharge status: Home / Self Care | Source: Ambulatory Visit | Attending: Oncology

## 2023-11-26 ENCOUNTER — Encounter (HOSPITAL_COMMUNITY): Payer: Self-pay

## 2023-11-26 DIAGNOSIS — K5730 Diverticulosis of large intestine without perforation or abscess without bleeding: Secondary | ICD-10-CM | POA: Diagnosis not present

## 2023-11-26 DIAGNOSIS — E785 Hyperlipidemia, unspecified: Secondary | ICD-10-CM | POA: Insufficient documentation

## 2023-11-26 DIAGNOSIS — C946 Myelodysplastic disease, not classified: Secondary | ICD-10-CM | POA: Insufficient documentation

## 2023-11-26 DIAGNOSIS — Z1379 Encounter for other screening for genetic and chromosomal anomalies: Secondary | ICD-10-CM | POA: Diagnosis not present

## 2023-11-26 DIAGNOSIS — Z87891 Personal history of nicotine dependence: Secondary | ICD-10-CM | POA: Insufficient documentation

## 2023-11-26 DIAGNOSIS — Z860100 Personal history of colon polyps, unspecified: Secondary | ICD-10-CM | POA: Diagnosis not present

## 2023-11-26 DIAGNOSIS — D75839 Thrombocytosis, unspecified: Secondary | ICD-10-CM | POA: Diagnosis not present

## 2023-11-26 DIAGNOSIS — H3530 Unspecified macular degeneration: Secondary | ICD-10-CM | POA: Diagnosis not present

## 2023-11-26 DIAGNOSIS — D649 Anemia, unspecified: Secondary | ICD-10-CM | POA: Diagnosis not present

## 2023-11-26 DIAGNOSIS — D759 Disease of blood and blood-forming organs, unspecified: Secondary | ICD-10-CM | POA: Diagnosis not present

## 2023-11-26 LAB — CBC WITH DIFFERENTIAL/PLATELET
Abs Immature Granulocytes: 0.05 10*3/uL (ref 0.00–0.07)
Basophils Absolute: 0.1 10*3/uL (ref 0.0–0.1)
Basophils Relative: 1 %
Eosinophils Absolute: 0.2 10*3/uL (ref 0.0–0.5)
Eosinophils Relative: 2 %
HCT: 35.3 % — ABNORMAL LOW (ref 36.0–46.0)
Hemoglobin: 11.4 g/dL — ABNORMAL LOW (ref 12.0–15.0)
Immature Granulocytes: 1 %
Lymphocytes Relative: 12 %
Lymphs Abs: 1.1 10*3/uL (ref 0.7–4.0)
MCH: 29.4 pg (ref 26.0–34.0)
MCHC: 32.3 g/dL (ref 30.0–36.0)
MCV: 91 fL (ref 80.0–100.0)
Monocytes Absolute: 1 10*3/uL (ref 0.1–1.0)
Monocytes Relative: 11 %
Neutro Abs: 6.8 10*3/uL (ref 1.7–7.7)
Neutrophils Relative %: 73 %
Platelets: 539 10*3/uL — ABNORMAL HIGH (ref 150–400)
RBC: 3.88 MIL/uL (ref 3.87–5.11)
RDW: 13.6 % (ref 11.5–15.5)
WBC: 9.2 10*3/uL (ref 4.0–10.5)
nRBC: 0 % (ref 0.0–0.2)

## 2023-11-26 MED ORDER — MIDAZOLAM HCL 2 MG/2ML IJ SOLN
INTRAMUSCULAR | Status: AC
  Filled 2023-11-26: qty 4

## 2023-11-26 MED ORDER — SODIUM CHLORIDE 0.9 % IV SOLN: INTRAVENOUS | Status: DC

## 2023-11-26 MED ORDER — FENTANYL CITRATE (PF) 100 MCG/2ML IJ SOLN
INTRAMUSCULAR | Status: AC
  Filled 2023-11-26: qty 2

## 2023-11-26 MED ORDER — MIDAZOLAM HCL 2 MG/2ML IJ SOLN
INTRAMUSCULAR | Status: AC | PRN
  Administered 2023-11-26 (×2): 1 mg via INTRAVENOUS

## 2023-11-26 MED ORDER — FENTANYL CITRATE (PF) 100 MCG/2ML IJ SOLN
INTRAMUSCULAR | Status: AC | PRN
  Administered 2023-11-26: 50 ug via INTRAVENOUS

## 2023-11-26 NOTE — Discharge Instructions (Signed)
 For questions /concerns may call Interventional Radiology at [phone] or  Interventional Radiology clinic [phone]   You may remove your dressing and shower tomorrow afternoon                                                                                 Bone Marrow Aspiration and Bone Marrow Biopsy, Adult, Care After The following information offers guidance on how to care for yourself after your procedure. Your health care provider may also give you more specific instructions. If you have problems or questions, contact your health care provider. What can I expect after the procedure? After the procedure, it is common to have: Mild pain and tenderness. Swelling. Bruising. Follow these instructions at home: Incision care  Follow instructions from your health care provider about how to take care of the incision site. Make sure you: Wash your hands with soap and water for at least 20 seconds before and after you change your bandage (dressing). If soap and water are not available, use hand sanitizer. Change your dressing as told by your health care provider. Leave stitches (sutures), skin glue, or adhesive strips in place. These skin closures may need to stay in place for 2 weeks or longer. If adhesive strip edges start to loosen and curl up, you may trim the loose edges. Do not remove adhesive strips completely unless your health care provider tells you to do that. Check your incision site every day for signs of infection. Check for: More redness, swelling, or pain. Fluid or blood. Warmth. Pus or a bad smell. Activity Return to your normal activities as told by your health care provider. Ask your health care provider what activities are safe for you. Do not lift anything that is heavier than 10 lb (4.5 kg), or the limit that you are told, until your health care provider says that it is safe. If you were given a sedative during the procedure, it can affect you for several hours. Do  not drive or operate machinery until your health care provider says that it is safe. General instructions  Take over-the-counter and prescription medicines only as told by your health care provider. Do not take baths, swim, or use a hot tub until your health care provider approves. Ask your health care provider if you may take showers. You may only be allowed to take sponge baths. If directed, put ice on the affected area. To do this: Put ice in a plastic bag. Place a towel between your skin and the bag. Leave the ice on for 20 minutes, 2-3 times a day. If your skin turns bright red, remove the ice right away to prevent skin damage. The risk of skin damage is higher if you cannot feel pain, heat, or cold. Contact a health care provider if: You have signs of infection. Your pain is not controlled with medicine. You have cancer, and a temperature of 100.88F (38C) or higher. Get help right away if: You have a temperature of 101F (38.3C) or higher, or as told by your health care provider. You have bleeding from the incision site that cannot be controlled. This information is not intended to replace advice given to you by  your health care provider. Make sure you discuss any questions you have with your health care provider. Document Revised: 11/17/2021 Document Reviewed: 11/17/2021 Elsevier Patient Education  2024 [employer]                                                                          Moderate Conscious Sedation, Adult, Care After After the procedure, it is common to have: Sleepiness for a few hours. Impaired judgment for a few hours. Trouble with balance. Nausea or vomiting if you eat too soon. Follow these instructions at home: For the time period you were told by your health care provider:  Rest. Do not participate in activities where you could fall or become injured. Do not drive or use machinery. Do not drink alcohol. Do not take sleeping pills or medicines that  cause drowsiness. Do not make important decisions or sign legal documents. Do not take care of children on your own. Eating and drinking Follow instructions from your health care provider about what you may eat and drink. Drink enough fluid to keep your urine pale yellow. If you vomit: Drink clear fluids slowly and in small amounts as you are able. Clear fluids include water, ice chips, low-calorie sports drinks, and fruit juice that has water added to it (diluted fruit juice). Eat light and bland foods in small amounts as you are able. These foods include bananas, applesauce, rice, lean meats, toast, and crackers. General instructions Take over-the-counter and prescription medicines only as told by your health care provider. Have a responsible adult stay with you for the time you are told. Do not use any products that contain nicotine or tobacco. These products include cigarettes, chewing tobacco, and vaping devices, such as e-cigarettes. If you need help quitting, ask your health care provider. Return to your normal activities as told by your health care provider. Ask your health care provider what activities are safe for you. Your health care provider may give you more instructions. Make sure you know what you can and cannot do. Contact a health care provider if: You are still sleepy or having trouble with balance after 24 hours. You feel light-headed. You vomit every time you eat or drink. You get a rash. You have a fever. You have redness or swelling around the IV site. Get help right away if: You have trouble breathing. You start to feel confused at home. These symptoms may be an emergency. Get help right away. Call 911. Do not wait to see if the symptoms will go away. Do not drive yourself to the hospital. This information is not intended to replace advice given to you by your health care provider. Make sure you discuss any questions you have with your health care  provider. Document Revised: 01/26/2022 Document Reviewed: 01/26/2022 Elsevier Patient Education  2024 [employer].

## 2023-11-26 NOTE — Procedures (Signed)
Interventional Radiology Procedure Note  Procedure: CT guided aspirate and core biopsy of right iliac bone Complications: None Recommendations: - Bedrest supine x 1 hrs - Hydrocodone PRN  Pain - Follow biopsy results  Signed,  Heath K. McCullough, MD   

## 2023-12-01 LAB — SURGICAL PATHOLOGY

## 2023-12-06 ENCOUNTER — Encounter (HOSPITAL_COMMUNITY): Payer: Self-pay | Admitting: Oncology

## 2023-12-07 DIAGNOSIS — H353211 Exudative age-related macular degeneration, right eye, with active choroidal neovascularization: Secondary | ICD-10-CM | POA: Diagnosis not present

## 2023-12-08 ENCOUNTER — Encounter: Payer: Self-pay | Admitting: Oncology

## 2023-12-08 ENCOUNTER — Inpatient Hospital Stay: Attending: Oncology

## 2023-12-08 ENCOUNTER — Inpatient Hospital Stay: Admitting: Oncology

## 2023-12-08 VITALS — BP 135/71 | HR 73 | Temp 98.0°F | Resp 15 | Wt 121.2 lb

## 2023-12-08 DIAGNOSIS — D473 Essential (hemorrhagic) thrombocythemia: Secondary | ICD-10-CM | POA: Insufficient documentation

## 2023-12-08 DIAGNOSIS — M810 Age-related osteoporosis without current pathological fracture: Secondary | ICD-10-CM

## 2023-12-08 DIAGNOSIS — I2510 Atherosclerotic heart disease of native coronary artery without angina pectoris: Secondary | ICD-10-CM | POA: Diagnosis not present

## 2023-12-08 DIAGNOSIS — Z1589 Genetic susceptibility to other disease: Secondary | ICD-10-CM

## 2023-12-08 DIAGNOSIS — D75839 Thrombocytosis, unspecified: Secondary | ICD-10-CM

## 2023-12-08 DIAGNOSIS — Z79899 Other long term (current) drug therapy: Secondary | ICD-10-CM | POA: Diagnosis not present

## 2023-12-08 DIAGNOSIS — Z7982 Long term (current) use of aspirin: Secondary | ICD-10-CM | POA: Insufficient documentation

## 2023-12-08 LAB — CBC WITH DIFFERENTIAL (CANCER CENTER ONLY)
Abs Immature Granulocytes: 0.04 10*3/uL (ref 0.00–0.07)
Basophils Absolute: 0.1 10*3/uL (ref 0.0–0.1)
Basophils Relative: 1 %
Eosinophils Absolute: 0.3 10*3/uL (ref 0.0–0.5)
Eosinophils Relative: 3 %
HCT: 37.8 % (ref 36.0–46.0)
Hemoglobin: 12.1 g/dL (ref 12.0–15.0)
Immature Granulocytes: 1 %
Lymphocytes Relative: 16 %
Lymphs Abs: 1.4 10*3/uL (ref 0.7–4.0)
MCH: 28.5 pg (ref 26.0–34.0)
MCHC: 32 g/dL (ref 30.0–36.0)
MCV: 88.9 fL (ref 80.0–100.0)
Monocytes Absolute: 0.8 10*3/uL (ref 0.1–1.0)
Monocytes Relative: 9 %
Neutro Abs: 6.2 10*3/uL (ref 1.7–7.7)
Neutrophils Relative %: 70 %
Platelet Count: 647 10*3/uL — ABNORMAL HIGH (ref 150–400)
RBC: 4.25 MIL/uL (ref 3.87–5.11)
RDW: 13.6 % (ref 11.5–15.5)
WBC Count: 8.8 10*3/uL (ref 4.0–10.5)
nRBC: 0 % (ref 0.0–0.2)

## 2023-12-08 LAB — CMP (CANCER CENTER ONLY)
ALT: 34 U/L (ref 0–44)
AST: 36 U/L (ref 15–41)
Albumin: 4.4 g/dL (ref 3.5–5.0)
Alkaline Phosphatase: 54 U/L (ref 38–126)
Anion gap: 5 (ref 5–15)
BUN: 15 mg/dL (ref 8–23)
CO2: 28 mmol/L (ref 22–32)
Calcium: 8.8 mg/dL — ABNORMAL LOW (ref 8.9–10.3)
Chloride: 107 mmol/L (ref 98–111)
Creatinine: 0.52 mg/dL (ref 0.44–1.00)
GFR, Estimated: >60 mL/min (ref >60)
Glucose, Bld: 112 mg/dL — ABNORMAL HIGH (ref 70–99)
Potassium: 3.6 mmol/L (ref 3.5–5.1)
Sodium: 140 mmol/L (ref 135–145)
Total Bilirubin: 0.4 mg/dL (ref 0.0–1.2)
Total Protein: 7 g/dL (ref 6.5–8.1)

## 2023-12-08 LAB — LACTATE DEHYDROGENASE: LDH: 162 U/L (ref 98–192)

## 2023-12-08 MED ORDER — HYDROXYUREA 500 MG PO CAPS: 500.0000 mg | ORAL_CAPSULE | Freq: Every day | ORAL | 3 refills | Status: DC

## 2023-12-08 NOTE — Progress Notes (Signed)
 [facility]  HEMATOLOGY CLINIC PROGRESS NOTE  PATIENT NAME: Yolanda Nicholson   MR#: [id number] DOB: 07/06/1946  Patient Care Team: Jeannine Milroy., MD as PCP - General (Internal Medicine)  Date of visit: 12/08/2023   ASSESSMENT & PLAN:   Yolanda Nicholson is a 78 y.o. lady with a past medical history of noncritical coronary artery disease, osteoporosis, diverticulitis, dyslipidemia, macular degeneration, was referred to our service in March 2025 for evaluation of thrombocytosis. MPL mutation positive. BM biopsy on 11/26/23 consistent with Essential Thrombocytosis.    Essential thrombocytosis (HCC) Chronic thrombocytosis with platelet counts ranging from 551,000 to 685,000 since August 2024.    On her consultation with us  on 10/15/2023, labs showed persistent thrombocytosis but platelet count was slightly better at 588,000.  White count 8800 with normal differential.  Hemoglobin normal at 12.2, MCV 91.2.  CMP unremarkable.  LDH, Sed rate, CRP were all within normal limits.  Iron studies showed no evidence of iron deficiency.  Ferritin was borderline low at 11.   We pursued JAK2 mutation analysis with reflex testing to include CALR, MPL, exon 12-15 mutations to rule out primary myeloproliferative neoplasm.  JAK2 mutation was negative.  However reflex testing showed that she has MPL mutation positivity. We also checked BCR/ABL 1 and it was negative.  Since patient was traveling out of country from 10/28/2023 until 11/14/2023, bone marrow biopsy was obtained on 11/26/2023 once she returned.    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  Megakaryocytes were significantly increased in number with clustering and abnormal lobulation.  Conventional cytogenetics showed normal female karyotype, XX.  IPSET thrombosis score of 1, low risk.    Platelet count is increased at 647,000 today.  White count  and hemoglobin remain within normal limits.  On 12/08/2023, we started her on cytoreductive therapy with hydroxyurea with goal to maintain platelet count below 400,000.  Initially we started her on hydroxyurea 500 mg once daily.  Will titrate dose depending on response.  She was advised to continue aspirin 81 mg daily to prevent vasomotor symptoms.  RTC in 3 weeks for follow-up with repeat labs.  Age-related osteoporosis without current pathological fracture She was recently started on Fosamax from July 2024.  Tolerating this well.  Coronary artery disease involving native coronary artery of native heart without angina pectoris Coronary artery disease with high CT-calcium  score. No myocardial infarctions or stents. Currently on statin therapy and baby aspirin to minimize risk of myocardial infarction. - Continue statin therapy - Continue baby aspirin once daily     I spent a total of 30 minutes during this encounter with the patient including review of chart and various tests results, discussions about plan of care and coordination of care plan.  I reviewed lab results and outside records for this visit and discussed relevant results with the patient. Diagnosis, plan of care and treatment options were also discussed in detail with the patient. Opportunity provided to ask questions and answers provided to her apparent satisfaction. Provided instructions to call our clinic with any problems, questions or concerns prior to return visit. I recommended to continue follow-up with PCP and sub-specialists. She verbalized understanding and agreed with the plan. No barriers to learning was detected.  Arlo Berber, MD  12/08/2023 5:37 PM  [facility] - A DEPT OF [facility] [address] [place] [address] Dept: [phone]  Dept Fax: [phone]   CHIEF COMPLAINT/ REASON FOR VISIT:  Follow up for Essential thrombocytosis,  MPL mutation positive.  Confirmed on bone marrow biopsy on 11/26/2023.  INTERVAL HISTORY:  Discussed the use of AI scribe software for clinical note transcription with the patient, who gave verbal consent to proceed.  History of Present Illness Yolanda Hamlette "Ottie Blonder" is a 78 year old female with essential thrombocytosis who presents for follow-up regarding her platelet count management.  She has essential thrombocytosis, confirmed by a bone marrow biopsy and blood tests showing a positive MPL mutation and increased precursor cells for platelets in the bone marrow. Her platelet count has been fluctuating and is currently elevated at 647,000, which is higher than the target range. She is currently taking two baby aspirins daily. No history of clotting issues.  Her glucose level was 112 today, which she confirmed was a fasting level. She does not have a diagnosis of diabetes, but her last A1c was 5.7, indicating prediabetes. She is currently taking Crestor  and Zetia for cholesterol management.  Her calcium  level is slightly low at 8.8. She has no other significant abnormalities in her blood work today.     SUMMARY OF HEMATOLOGIC HISTORY:  Routine labs at her PCPs office on 08/30/2023 showed platelet count of 682,000.  White count was 7800 with normal differential, hemoglobin 12.1.  Previously labs in September 2024 showed platelet count of 551,000 and labs in August 2024 showed platelet count of 685,000.  Given persistent thrombocytosis, referral was sent to us  for further evaluation.   In August 2024, the patient experienced a gastrointestinal illness that she suspected was C. diff, but tests were negative. This illness was followed by a bout of diverticulitis, a condition the patient has experienced three times over the past few years. Since this time, the patient has reported a lack of energy and fatigue, which is unusual for her.   The patient is currently on cholesterol medication  and takes aspirin regularly.   She did not have prior splenectomy. She had no prior history or diagnosis of cancer. Her age appropriate screening programs are up-to-date. Patient has never been diagnosed with thrombotic events.   Chronic thrombocytosis with platelet counts ranging from 551,000 to 685,000 since August 2024.    Differential diagnosis includes essential thrombocytosis (ET), reactive thrombocytosis due to stress, infection, iron deficiency, or thyroid  abnormalities.    On her consultation with us  on 10/15/2023, labs showed persistent thrombocytosis but platelet count was slightly better at 588,000.  White count 8800 with normal differential.  Hemoglobin normal at 12.2, MCV 91.2.  CMP unremarkable.  LDH, Sed rate, CRP were all within normal limits.  Iron studies showed no evidence of iron deficiency.  Ferritin was borderline low at 11.   We pursued JAK2 mutation analysis with reflex testing to include CALR, MPL, exon 12-15 mutations to rule out primary myeloproliferative neoplasm.  JAK2 mutation was negative.  However reflex testing showed that she has MPL mutation positivity. We also checked BCR/ABL 1 and it was negative.  Since patient was traveling out of country from 10/28/2023 until 11/14/2023, bone marrow biopsy was obtained on 11/26/2023 once she returned.    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  Megakaryocytes were significantly increased in number with clustering and abnormal lobulation.  Conventional cytogenetics showed normal female karyotype, XX.  IPSET thrombosis score of 1, low risk.    On 12/08/2023, we started her on cytoreductive therapy with hydroxyurea  with goal to maintain platelet count below 400,000.  She was advised to continue aspirin 81 mg daily to prevent vasomotor symptoms.  I have reviewed the past medical history, past surgical history, social history and family history with the  patient and they are unchanged from previous note.  ALLERGIES: She is allergic to atorvastatin, betadine [povidone iodine], penicillins, and sulfa antibiotics.  MEDICATIONS:  Current Outpatient Medications  Medication Sig Dispense Refill   alendronate (FOSAMAX) 70 MG tablet Take 70 mg by mouth once a week.     Cholecalciferol (VITAMIN D) 2000 UNITS tablet Take 2,000 Units by mouth daily.      ezetimibe (ZETIA) 10 MG tablet Take 10 mg by mouth daily.     hydroxyurea (HYDREA) 500 MG capsule Take 1 capsule (500 mg total) by mouth daily. May take with food to minimize GI side effects. 30 capsule 3   Multiple Vitamins-Minerals (PRESERVISION AREDS 2 PO) Take by mouth.     Propylene Glycol (SYSTANE BALANCE OP) Apply to eye.     rosuvastatin  (CRESTOR ) 20 MG tablet Take 20 mg by mouth daily.     tobramycin (TOBREX) 0.3 % ophthalmic solution      No current facility-administered medications for this visit.     REVIEW OF SYSTEMS:    Review of Systems - Oncology  All other pertinent systems were reviewed with the patient and are negative.  PHYSICAL EXAMINATION:   Onc Performance Status - 12/08/23 0833       ECOG Perf Status   ECOG Perf Status Fully active, able to carry on all pre-disease performance without restriction      KPS SCALE   KPS % SCORE Able to carry on normal activity, minor s/s of disease             Vitals:   12/08/23 0819  BP: 135/71  Pulse: 73  Resp: 15  Temp: 98 F (36.7 C)  SpO2: 100%   Filed Weights   12/08/23 0819  Weight: 121 lb 3.2 oz (55 kg)    Physical Exam Constitutional:      General: She is not in acute distress.    Appearance: Normal appearance.  HENT:     Head: Normocephalic and atraumatic.  Eyes:     General: No scleral icterus.    Conjunctiva/sclera: Conjunctivae normal.  Cardiovascular:     Rate and Rhythm: Normal rate and regular rhythm.  Pulmonary:     Effort: Pulmonary effort is normal.  Abdominal:     General: There is no  distension.  Musculoskeletal:     Right lower leg: No edema.     Left lower leg: No edema.  Neurological:     General: No focal deficit present.     Mental Status: She is alert and oriented to person, place, and time.  Psychiatric:        Mood and Affect: Mood normal.        Behavior: Behavior normal.        Thought Content: Thought content normal.      LABORATORY DATA:   I have reviewed the data as listed.  Results for orders placed or performed in visit on 12/08/23  Lactate dehydrogenase  Result Value Ref Range   LDH 162 98 - 192 U/L  CMP (Cancer Center only)  Result Value Ref Range   Sodium 140 135 - 145 mmol/L   Potassium 3.6 3.5 - 5.1 mmol/L   Chloride 107 98 - 111 mmol/L   CO2 28 22 -  32 mmol/L   Glucose, Bld 112 (H) 70 - 99 mg/dL   BUN 15 8 - 23 mg/dL   Creatinine 6.29 5.28 - 1.00 mg/dL   Calcium  8.8 (L) 8.9 - 10.3 mg/dL   Total Protein 7.0 6.5 - 8.1 g/dL   Albumin 4.4 3.5 - 5.0 g/dL   AST 36 15 - 41 U/L   ALT 34 0 - 44 U/L   Alkaline Phosphatase 54 38 - 126 U/L   Total Bilirubin 0.4 0.0 - 1.2 mg/dL   GFR, Estimated >41 >32 mL/min   Anion gap 5 5 - 15  CBC with Differential (Cancer Center Only)  Result Value Ref Range   WBC Count 8.8 4.0 - 10.5 K/uL   RBC 4.25 3.87 - 5.11 MIL/uL   Hemoglobin 12.1 12.0 - 15.0 g/dL   HCT 44.0 10.2 - 72.5 %   MCV 88.9 80.0 - 100.0 fL   MCH 28.5 26.0 - 34.0 pg   MCHC 32.0 30.0 - 36.0 g/dL   RDW 36.6 44.0 - 34.7 %   Platelet Count 647 (H) 150 - 400 K/uL   nRBC 0.0 0.0 - 0.2 %   Neutrophils Relative % 70 %   Neutro Abs 6.2 1.7 - 7.7 K/uL   Lymphocytes Relative 16 %   Lymphs Abs 1.4 0.7 - 4.0 K/uL   Monocytes Relative 9 %   Monocytes Absolute 0.8 0.1 - 1.0 K/uL   Eosinophils Relative 3 %   Eosinophils Absolute 0.3 0.0 - 0.5 K/uL   Basophils Relative 1 %   Basophils Absolute 0.1 0.0 - 0.1 K/uL   Immature Granulocytes 1 %   Abs Immature Granulocytes 0.04 0.00 - 0.07 K/uL     RADIOGRAPHIC STUDIES:  I have personally  reviewed the radiological images as listed and agree with the findings in the report.  CT BONE MARROW BIOPSY & ASPIRATION Result Date: 11/26/2023 INDICATION: 78 year old female with possible primary thrombocytosis EXAM: CT GUIDED BONE MARROW ASPIRATION AND CORE BIOPSY Interventional Radiologist:  Roxie Cord, MD MEDICATIONS: None. ANESTHESIA/SEDATION: Moderate (conscious) sedation was employed during this procedure. A total of 2 milligrams versed  and 100 micrograms fentanyl  were administered intravenously by the Radiology nurse. The patient's level of consciousness and vital signs were monitored continuously by radiology nursing throughout the procedure under my direct supervision. Total monitored sedation time: 10 minutes FLUOROSCOPY: None COMPLICATIONS: None immediate. Estimated blood loss: <25 mL PROCEDURE: Informed written consent was obtained from the patient after a thorough discussion of the procedural risks, benefits and alternatives. All questions were addressed. Maximal Sterile Barrier Technique was utilized including caps, mask, sterile gowns, sterile gloves, sterile drape, hand hygiene and skin antiseptic. A timeout was performed prior to the initiation of the procedure. The patient was positioned prone and non-contrast localization CT was performed of the pelvis to demonstrate the iliac marrow spaces. Maximal barrier sterile technique utilized including caps, mask, sterile gowns, sterile gloves, large sterile drape, hand hygiene, and betadine prep. Under sterile conditions and local anesthesia, an 11 gauge coaxial bone biopsy needle was advanced into the right iliac marrow space. Needle position was confirmed with CT imaging. Initially, bone marrow aspiration was performed. Next, the 11 gauge outer cannula was utilized to obtain a right iliac bone marrow core biopsy. Needle was removed. Hemostasis was obtained with compression. The patient tolerated the procedure well. Samples were prepared  with the cytotechnologist. IMPRESSION: Successful right sided iliac bone marrow aspiration and core biopsy under CT guidance. Electronically Signed   By: Phillip Breach  Marne Sings M.D.   On: 11/26/2023 11:41   MM 3D SCREENING MAMMOGRAM BILATERAL BREAST Result Date: 11/22/2023 CLINICAL DATA:  Screening. EXAM: DIGITAL SCREENING BILATERAL MAMMOGRAM WITH TOMOSYNTHESIS AND CAD TECHNIQUE: Bilateral screening digital craniocaudal and mediolateral oblique mammograms were obtained. Bilateral screening digital breast tomosynthesis was performed. The images were evaluated with computer-aided detection. COMPARISON:  Previous exam(s). ACR Breast Density Category b: There are scattered areas of fibroglandular density. FINDINGS: There are no findings suspicious for malignancy. IMPRESSION: No mammographic evidence of malignancy. A result letter of this screening mammogram will be mailed directly to the patient. RECOMMENDATION: Screening mammogram in one year. (Code:SM-B-01Y) BI-RADS CATEGORY  1: Negative. Electronically Signed   By: Dina  Arceo M.D.   On: 11/22/2023 08:45     Orders Placed This Encounter  Procedures   CBC with Differential (Cancer Center Only)    Standing Status:   Future    Expected Date:   12/29/2023    Expiration Date:   12/07/2024   CMP (Cancer Center only)    Standing Status:   Future    Expected Date:   12/29/2023    Expiration Date:   12/07/2024   Lactate dehydrogenase    Standing Status:   Future    Expected Date:   12/29/2023    Expiration Date:   12/07/2024     Future Appointments  Date Time Provider Department Center  12/29/2023  8:15 AM [facility] None  12/29/2023  8:45 AM Tola Meas, Gale Jude, MD [facility] None     This document was completed utilizing speech recognition software. Grammatical errors, random word insertions, pronoun errors, and incomplete sentences are an occasional consequence of this system due to software limitations, ambient noise, and hardware issues. Any  formal questions or concerns about the content, text or information contained within the body of this dictation should be directly addressed to the provider for clarification.

## 2023-12-08 NOTE — Assessment & Plan Note (Addendum)
 Chronic thrombocytosis with platelet counts ranging from 551,000 to 685,000 since August 2024.    On her consultation with us  on 10/15/2023, labs showed persistent thrombocytosis but platelet count was slightly better at 588,000.  White count 8800 with normal differential.  Hemoglobin normal at 12.2, MCV 91.2.  CMP unremarkable.  LDH, Sed rate, CRP were all within normal limits.  Iron studies showed no evidence of iron deficiency.  Ferritin was borderline low at 11.   We pursued JAK2 mutation analysis with reflex testing to include CALR, MPL, exon 12-15 mutations to rule out primary myeloproliferative neoplasm.  JAK2 mutation was negative.  However reflex testing showed that she has MPL mutation positivity. We also checked BCR/ABL 1 and it was negative.  Since patient was traveling out of country from 10/28/2023 until 11/14/2023, bone marrow biopsy was obtained on 11/26/2023 once she returned.    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  Megakaryocytes were significantly increased in number with clustering and abnormal lobulation.  Conventional cytogenetics showed normal female karyotype, XX.  IPSET thrombosis score of 1, low risk.    Platelet count is increased at 647,000 today.  White count and hemoglobin remain within normal limits.  On 12/08/2023, we started her on cytoreductive therapy with hydroxyurea with goal to maintain platelet count below 400,000.  Initially we started her on hydroxyurea 500 mg once daily.  Will titrate dose depending on response.  She was advised to continue aspirin 81 mg daily to prevent vasomotor symptoms.  RTC in 3 weeks for follow-up with repeat labs.

## 2023-12-08 NOTE — Assessment & Plan Note (Signed)
 Coronary artery disease with high CT-calcium score. No myocardial infarctions or stents. Currently on statin therapy and baby aspirin to minimize risk of myocardial infarction. - Continue statin therapy - Continue baby aspirin once daily

## 2023-12-08 NOTE — Assessment & Plan Note (Signed)
 She was recently started on Fosamax from July 2024.  Tolerating this well.

## 2023-12-29 ENCOUNTER — Encounter: Payer: Self-pay | Admitting: Oncology

## 2023-12-29 ENCOUNTER — Inpatient Hospital Stay

## 2023-12-29 ENCOUNTER — Inpatient Hospital Stay: Attending: Oncology | Admitting: Oncology

## 2023-12-29 VITALS — BP 122/60 | HR 68 | Temp 97.9°F | Resp 16 | Ht 63.0 in | Wt 120.4 lb

## 2023-12-29 DIAGNOSIS — Z7964 Long term (current) use of myelosuppressive agent: Secondary | ICD-10-CM | POA: Diagnosis not present

## 2023-12-29 DIAGNOSIS — Z7982 Long term (current) use of aspirin: Secondary | ICD-10-CM | POA: Diagnosis not present

## 2023-12-29 DIAGNOSIS — Z79899 Other long term (current) drug therapy: Secondary | ICD-10-CM | POA: Diagnosis not present

## 2023-12-29 DIAGNOSIS — D473 Essential (hemorrhagic) thrombocythemia: Secondary | ICD-10-CM | POA: Insufficient documentation

## 2023-12-29 DIAGNOSIS — E876 Hypokalemia: Secondary | ICD-10-CM | POA: Diagnosis not present

## 2023-12-29 LAB — CMP (CANCER CENTER ONLY)
ALT: 33 U/L (ref 0–44)
AST: 33 U/L (ref 15–41)
Albumin: 4.3 g/dL (ref 3.5–5.0)
Alkaline Phosphatase: 48 U/L (ref 38–126)
Anion gap: 6 (ref 5–15)
BUN: 18 mg/dL (ref 8–23)
CO2: 28 mmol/L (ref 22–32)
Calcium: 8.8 mg/dL — ABNORMAL LOW (ref 8.9–10.3)
Chloride: 107 mmol/L (ref 98–111)
Creatinine: 0.58 mg/dL (ref 0.44–1.00)
GFR, Estimated: >60 mL/min (ref >60)
Glucose, Bld: 105 mg/dL — ABNORMAL HIGH (ref 70–99)
Potassium: 3.3 mmol/L — ABNORMAL LOW (ref 3.5–5.1)
Sodium: 141 mmol/L (ref 135–145)
Total Bilirubin: 0.5 mg/dL (ref 0.0–1.2)
Total Protein: 6.9 g/dL (ref 6.5–8.1)

## 2023-12-29 LAB — CBC WITH DIFFERENTIAL (CANCER CENTER ONLY)
Abs Immature Granulocytes: 0.01 10*3/uL (ref 0.00–0.07)
Basophils Absolute: 0.1 10*3/uL (ref 0.0–0.1)
Basophils Relative: 1 %
Eosinophils Absolute: 0.2 10*3/uL (ref 0.0–0.5)
Eosinophils Relative: 3 %
HCT: 35.3 % — ABNORMAL LOW (ref 36.0–46.0)
Hemoglobin: 11.6 g/dL — ABNORMAL LOW (ref 12.0–15.0)
Immature Granulocytes: 0 %
Lymphocytes Relative: 18 %
Lymphs Abs: 1.3 10*3/uL (ref 0.7–4.0)
MCH: 29.5 pg (ref 26.0–34.0)
MCHC: 32.9 g/dL (ref 30.0–36.0)
MCV: 89.8 fL (ref 80.0–100.0)
Monocytes Absolute: 0.7 10*3/uL (ref 0.1–1.0)
Monocytes Relative: 10 %
Neutro Abs: 5 10*3/uL (ref 1.7–7.7)
Neutrophils Relative %: 68 %
Platelet Count: 467 10*3/uL — ABNORMAL HIGH (ref 150–400)
RBC: 3.93 MIL/uL (ref 3.87–5.11)
RDW: 15.1 % (ref 11.5–15.5)
WBC Count: 7.3 10*3/uL (ref 4.0–10.5)
nRBC: 0 % (ref 0.0–0.2)

## 2023-12-29 LAB — LACTATE DEHYDROGENASE: LDH: 142 U/L (ref 98–192)

## 2023-12-29 NOTE — Assessment & Plan Note (Addendum)
 Chronic thrombocytosis with platelet counts ranging from 551,000 to 685,000 since August 2024.    On her consultation with us  on 10/15/2023, labs showed persistent thrombocytosis but platelet count was slightly better at 588,000.  White count 8800 with normal differential.  Hemoglobin normal at 12.2, MCV 91.2.  CMP unremarkable.  LDH, Sed rate, CRP were all within normal limits.  Iron studies showed no evidence of iron deficiency.  Ferritin was borderline low at 11.   We pursued JAK2 mutation analysis with reflex testing to include CALR, MPL, exon 12-15 mutations to rule out primary myeloproliferative neoplasm.  JAK2 mutation was negative.  However reflex testing showed that she has MPL mutation positivity. We also checked BCR/ABL 1 and it was negative.  Since patient was traveling out of country from 10/28/2023 until 11/14/2023, bone marrow biopsy was obtained on 11/26/2023 once she returned.    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  Megakaryocytes were significantly increased in number with clustering and abnormal lobulation.  Conventional cytogenetics showed normal female karyotype, XX.  IPSET thrombosis score of 1, low risk.    Platelet count was increased at 647,000 on 12/08/2023.  White count and hemoglobin remain within normal limits.  On 12/08/2023, we started her on cytoreductive therapy with hydroxyurea  with goal to maintain platelet count below 400,000.  Initially we started her on hydroxyurea  500 mg once daily.   She has been compliant with hydroxyurea  500 mg once daily and tolerating it well.  No major side effects.  Platelet count is much better at 467,000 today.  Hemoglobin 11.6, white count normal.  Will continue current dose of hydroxyurea  and monitor labs closely.  She was advised to continue aspirin 81 mg daily to prevent vasomotor symptoms and for clot prevention.  RTC in 4 weeks for follow-up with  repeat labs.

## 2023-12-29 NOTE — Progress Notes (Signed)
 [facility]  HEMATOLOGY CLINIC PROGRESS NOTE  PATIENT NAME: Yolanda Nicholson   MR#: [id number] DOB: 1946/06/21  Patient Care Team: Jeannine Milroy., MD as PCP - General (Internal Medicine)  Date of visit: 12/29/2023   ASSESSMENT & PLAN:   Yolanda Nicholson is a 78 y.o. lady with a past medical history of noncritical coronary artery disease, osteoporosis, diverticulitis, dyslipidemia, macular degeneration, was referred to our service in March 2025 for evaluation of thrombocytosis. MPL mutation positive. BM biopsy on 11/26/23 consistent with Essential Thrombocytosis.    Essential thrombocytosis (HCC) Chronic thrombocytosis with platelet counts ranging from 551,000 to 685,000 since August 2024.    On her consultation with us  on 10/15/2023, labs showed persistent thrombocytosis but platelet count was slightly better at 588,000.  White count 8800 with normal differential.  Hemoglobin normal at 12.2, MCV 91.2.  CMP unremarkable.  LDH, Sed rate, CRP were all within normal limits.  Iron studies showed no evidence of iron deficiency.  Ferritin was borderline low at 11.   We pursued JAK2 mutation analysis with reflex testing to include CALR, MPL, exon 12-15 mutations to rule out primary myeloproliferative neoplasm.  JAK2 mutation was negative.  However reflex testing showed that she has MPL mutation positivity. We also checked BCR/ABL 1 and it was negative.  Since patient was traveling out of country from 10/28/2023 until 11/14/2023, bone marrow biopsy was obtained on 11/26/2023 once she returned.    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  Megakaryocytes were significantly increased in number with clustering and abnormal lobulation.  Conventional cytogenetics showed normal female karyotype, XX.  IPSET thrombosis score of 1, low risk.    Platelet count was increased at 647,000 on 12/08/2023.   White count and hemoglobin remain within normal limits.  On 12/08/2023, we started her on cytoreductive therapy with hydroxyurea  with goal to maintain platelet count below 400,000.  Initially we started her on hydroxyurea  500 mg once daily.   She has been compliant with hydroxyurea  500 mg once daily and tolerating it well.  No major side effects.  Platelet count is much better at 467,000 today.  Hemoglobin 11.6, white count normal.  Will continue current dose of hydroxyurea  and monitor labs closely.  She was advised to continue aspirin 81 mg daily to prevent vasomotor symptoms and for clot prevention.  RTC in 4 weeks for follow-up with repeat labs.  Hypokalemia Mild hypokalemia with potassium level at 3.3 (normal is 3.5). No immediate need for potassium supplementation. Hydrea  does not typically affect potassium levels. Encouraged dietary intake of potassium-rich foods like bananas and oranges.    I spent a total of 27 minutes during this encounter with the patient including review of chart and various tests results, discussions about plan of care and coordination of care plan.  I reviewed lab results and outside records for this visit and discussed relevant results with the patient. Diagnosis, plan of care and treatment options were also discussed in detail with the patient. Opportunity provided to ask questions and answers provided to her apparent satisfaction. Provided instructions to call our clinic with any problems, questions or concerns prior to return visit. I recommended to continue follow-up with PCP and sub-specialists. She verbalized understanding and agreed with the plan. No barriers to learning was detected.  Arlo Berber, MD  12/29/2023 10:12 AM  [facility] - A DEPT OF [facility] [address] [place] [address] Dept: [phone] Dept Fax: [phone]   CHIEF COMPLAINT/ REASON FOR VISIT:  Follow up for  Essential thrombocytosis, MPL mutation positive.  Confirmed on bone marrow biopsy on 11/26/2023.  INTERVAL HISTORY:  Discussed the use of AI scribe software for clinical note transcription with the patient, who gave verbal consent to proceed.  History of Present Illness  Yolanda Nicholson "Ottie Blonder" is a 78 year old female with essential thrombocytosis who presents for follow-up of her condition.  She is currently taking Hydrea  500 mg once daily. She is doing well on the medication with no alarming side effects, only minor manageable ones. Her platelet count has decreased from 647,000 to 467,000, and her hemoglobin is currently at 11.6 g/dL.  Her potassium level is slightly low at 3.3 mmol/L. She has not experienced any vasomotor symptoms such as palpitations, flushing, or skin sensitivity, which aspirin helps to prevent.  She inquires about potential long-term side effects of Hydrea , such as skin ulcers, which are more common in conditions requiring higher doses. She has not experienced nausea or stomach distress, which are common initial side effects.  She drinks wine every night with dinner but in moderation, and she takes her Hydrea  in the morning with breakfast. She is also planning a trip [place] in November.    SUMMARY OF HEMATOLOGIC HISTORY:  Routine labs at her PCPs office on 08/30/2023 showed platelet count of 682,000.  White count was 7800 with normal differential, hemoglobin 12.1.  Previously labs in September 2024 showed platelet count of 551,000 and labs in August 2024 showed platelet count of 685,000.  Given persistent thrombocytosis, referral was sent to us  for further evaluation.   In August 2024, the patient experienced a gastrointestinal illness that she suspected was C. diff, but tests were negative. This illness was followed by a bout of diverticulitis, a condition the patient has experienced three times over the past few years. Since this time, the patient has reported  a lack of energy and fatigue, which is unusual for her.   The patient is currently on cholesterol medication and takes aspirin regularly.   She did not have prior splenectomy. She had no prior history or diagnosis of cancer. Her age appropriate screening programs are up-to-date. Patient has never been diagnosed with thrombotic events.   Chronic thrombocytosis with platelet counts ranging from 551,000 to 685,000 since August 2024.    Differential diagnosis includes essential thrombocytosis (ET), reactive thrombocytosis due to stress, infection, iron deficiency, or thyroid  abnormalities.    On her consultation with us  on 10/15/2023, labs showed persistent thrombocytosis but platelet count was slightly better at 588,000.  White count 8800 with normal differential.  Hemoglobin normal at 12.2, MCV 91.2.  CMP unremarkable.  LDH, Sed rate, CRP were all within normal limits.  Iron studies showed no evidence of iron deficiency.  Ferritin was borderline low at 11.   We pursued JAK2 mutation analysis with reflex testing to include CALR, MPL, exon 12-15 mutations to rule out primary myeloproliferative neoplasm.  JAK2 mutation was negative.  However reflex testing showed that she has MPL mutation positivity. We also checked BCR/ABL 1 and it was negative.  Since patient was traveling out of country from 10/28/2023 until 11/14/2023, bone marrow biopsy was obtained on 11/26/2023 once she returned.    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  Megakaryocytes were significantly increased in number with clustering and abnormal lobulation.  Conventional cytogenetics  showed normal female karyotype, XX.  IPSET thrombosis score of 1, low risk.    On 12/08/2023, we started her on cytoreductive therapy with hydroxyurea  with goal to maintain platelet count below 400,000.  She was advised to continue aspirin 81 mg daily to prevent vasomotor  symptoms.  I have reviewed the past medical history, past surgical history, social history and family history with the patient and they are unchanged from previous note.  ALLERGIES: She is allergic to atorvastatin, betadine [povidone iodine], penicillins, and sulfa antibiotics.  MEDICATIONS:  Current Outpatient Medications  Medication Sig Dispense Refill   alendronate (FOSAMAX) 70 MG tablet Take 70 mg by mouth once a week.     aspirin EC 81 MG tablet Take 162 mg by mouth daily.     Cholecalciferol (VITAMIN D) 2000 UNITS tablet Take 2,000 Units by mouth daily.      ezetimibe (ZETIA) 10 MG tablet Take 10 mg by mouth daily.     hydroxyurea  (HYDREA ) 500 MG capsule Take 1 capsule (500 mg total) by mouth daily. May take with food to minimize GI side effects. 30 capsule 3   Multiple Vitamins-Minerals (PRESERVISION AREDS 2 PO) Take by mouth.     Propylene Glycol (SYSTANE BALANCE OP) Apply to eye.     rosuvastatin  (CRESTOR ) 20 MG tablet Take 20 mg by mouth daily.     tobramycin (TOBREX) 0.3 % ophthalmic solution      No current facility-administered medications for this visit.     REVIEW OF SYSTEMS:    Review of Systems  Respiratory:  Shortness of breath: on exertion.   Gastrointestinal:  Positive for nausea (mild, after eating).  Neurological:  Positive for dizziness.    All other pertinent systems were reviewed with the patient and are negative.  PHYSICAL EXAMINATION:   Onc Performance Status - 12/29/23 0852       ECOG Perf Status   ECOG Perf Status Fully active, able to carry on all pre-disease performance without restriction (P)       KPS SCALE   KPS % SCORE Able to carry on normal activity, minor s/s of disease (P)              Vitals:   12/29/23 0825  BP: 122/60  Pulse: 68  Resp: 16  Temp: 97.9 F (36.6 C)  SpO2: 99%   Filed Weights   12/29/23 0825  Weight: 120 lb 6.4 oz (54.6 kg)    Physical Exam Constitutional:      General: She is not in acute  distress.    Appearance: Normal appearance.  HENT:     Head: Normocephalic and atraumatic.  Eyes:     General: No scleral icterus.    Conjunctiva/sclera: Conjunctivae normal.  Cardiovascular:     Rate and Rhythm: Normal rate and regular rhythm.  Pulmonary:     Effort: Pulmonary effort is normal.  Abdominal:     General: There is no distension.  Musculoskeletal:     Right lower leg: No edema.     Left lower leg: No edema.  Neurological:     General: No focal deficit present.     Mental Status: She is alert and oriented to person, place, and time.  Psychiatric:        Mood and Affect: Mood normal.        Behavior: Behavior normal.        Thought Content: Thought content normal.      LABORATORY DATA:   I have reviewed the  data as listed.  Results for orders placed or performed in visit on 12/29/23  Lactate dehydrogenase  Result Value Ref Range   LDH 142 98 - 192 U/L  CMP (Cancer Center only)  Result Value Ref Range   Sodium 141 135 - 145 mmol/L   Potassium 3.3 (L) 3.5 - 5.1 mmol/L   Chloride 107 98 - 111 mmol/L   CO2 28 22 - 32 mmol/L   Glucose, Bld 105 (H) 70 - 99 mg/dL   BUN 18 8 - 23 mg/dL   Creatinine 4.09 8.11 - 1.00 mg/dL   Calcium  8.8 (L) 8.9 - 10.3 mg/dL   Total Protein 6.9 6.5 - 8.1 g/dL   Albumin 4.3 3.5 - 5.0 g/dL   AST 33 15 - 41 U/L   ALT 33 0 - 44 U/L   Alkaline Phosphatase 48 38 - 126 U/L   Total Bilirubin 0.5 0.0 - 1.2 mg/dL   GFR, Estimated >91 >47 mL/min   Anion gap 6 5 - 15  CBC with Differential (Cancer Center Only)  Result Value Ref Range   WBC Count 7.3 4.0 - 10.5 K/uL   RBC 3.93 3.87 - 5.11 MIL/uL   Hemoglobin 11.6 (L) 12.0 - 15.0 g/dL   HCT 82.9 (L) 56.2 - 13.0 %   MCV 89.8 80.0 - 100.0 fL   MCH 29.5 26.0 - 34.0 pg   MCHC 32.9 30.0 - 36.0 g/dL   RDW 86.5 78.4 - 69.6 %   Platelet Count 467 (H) 150 - 400 K/uL   nRBC 0.0 0.0 - 0.2 %   Neutrophils Relative % 68 %   Neutro Abs 5.0 1.7 - 7.7 K/uL   Lymphocytes Relative 18 %   Lymphs  Abs 1.3 0.7 - 4.0 K/uL   Monocytes Relative 10 %   Monocytes Absolute 0.7 0.1 - 1.0 K/uL   Eosinophils Relative 3 %   Eosinophils Absolute 0.2 0.0 - 0.5 K/uL   Basophils Relative 1 %   Basophils Absolute 0.1 0.0 - 0.1 K/uL   Immature Granulocytes 0 %   Abs Immature Granulocytes 0.01 0.00 - 0.07 K/uL     RADIOGRAPHIC STUDIES:  No recent pertinent imaging available to review.   Orders Placed This Encounter  Procedures   CBC with Differential (Cancer Center Only)    Standing Status:   Standing    Number of Occurrences:   6    Expiration Date:   12/28/2024   CMP (Cancer Center only)    Standing Status:   Standing    Number of Occurrences:   6    Expiration Date:   12/28/2024   Lactate dehydrogenase    Standing Status:   Standing    Number of Occurrences:   6    Expiration Date:   12/28/2024     Future Appointments  Date Time Provider Department Center  01/26/2024  8:00 AM [facility] None  01/26/2024  8:30 AM Jasiah Buntin, Gale Jude, MD [facility] None     This document was completed utilizing speech recognition software. Grammatical errors, random word insertions, pronoun errors, and incomplete sentences are an occasional consequence of this system due to software limitations, ambient noise, and hardware issues. Any formal questions or concerns about the content, text or information contained within the body of this dictation should be directly addressed to the provider for clarification.

## 2023-12-29 NOTE — Assessment & Plan Note (Signed)
 Mild hypokalemia with potassium level at 3.3 (normal is 3.5). No immediate need for potassium supplementation. Hydrea  does not typically affect potassium levels. Encouraged dietary intake of potassium-rich foods like bananas and oranges.

## 2024-01-04 NOTE — Addendum Note (Signed)
 Encounter addended by: Bernabe Brew, RN on: 01/04/2024 12:00 PM  Actions taken: Imaging Exam ended

## 2024-01-26 ENCOUNTER — Inpatient Hospital Stay: Attending: Oncology

## 2024-01-26 ENCOUNTER — Inpatient Hospital Stay: Admitting: Oncology

## 2024-01-26 ENCOUNTER — Encounter: Payer: Self-pay | Admitting: Oncology

## 2024-01-26 VITALS — BP 121/61 | HR 67 | Temp 98.4°F | Resp 17 | Ht 63.0 in | Wt 121.1 lb

## 2024-01-26 DIAGNOSIS — Z79899 Other long term (current) drug therapy: Secondary | ICD-10-CM | POA: Diagnosis not present

## 2024-01-26 DIAGNOSIS — E876 Hypokalemia: Secondary | ICD-10-CM | POA: Diagnosis not present

## 2024-01-26 DIAGNOSIS — D473 Essential (hemorrhagic) thrombocythemia: Secondary | ICD-10-CM | POA: Insufficient documentation

## 2024-01-26 DIAGNOSIS — Z7982 Long term (current) use of aspirin: Secondary | ICD-10-CM | POA: Insufficient documentation

## 2024-01-26 DIAGNOSIS — Z7964 Long term (current) use of myelosuppressive agent: Secondary | ICD-10-CM | POA: Diagnosis not present

## 2024-01-26 LAB — CBC WITH DIFFERENTIAL (CANCER CENTER ONLY)
Abs Immature Granulocytes: 0.02 10*3/uL (ref 0.00–0.07)
Basophils Absolute: 0.1 10*3/uL (ref 0.0–0.1)
Basophils Relative: 1 %
Eosinophils Absolute: 0.1 10*3/uL (ref 0.0–0.5)
Eosinophils Relative: 2 %
HCT: 36.4 % (ref 36.0–46.0)
Hemoglobin: 11.9 g/dL — ABNORMAL LOW (ref 12.0–15.0)
Immature Granulocytes: 0 %
Lymphocytes Relative: 18 %
Lymphs Abs: 1.4 10*3/uL (ref 0.7–4.0)
MCH: 30.1 pg (ref 26.0–34.0)
MCHC: 32.7 g/dL (ref 30.0–36.0)
MCV: 91.9 fL (ref 80.0–100.0)
Monocytes Absolute: 0.8 10*3/uL (ref 0.1–1.0)
Monocytes Relative: 10 %
Neutro Abs: 5.2 10*3/uL (ref 1.7–7.7)
Neutrophils Relative %: 69 %
Platelet Count: 425 10*3/uL — ABNORMAL HIGH (ref 150–400)
RBC: 3.96 MIL/uL (ref 3.87–5.11)
RDW: 16.7 % — ABNORMAL HIGH (ref 11.5–15.5)
WBC Count: 7.6 10*3/uL (ref 4.0–10.5)
nRBC: 0 % (ref 0.0–0.2)

## 2024-01-26 LAB — CMP (CANCER CENTER ONLY)
ALT: 45 U/L — ABNORMAL HIGH (ref 0–44)
AST: 49 U/L — ABNORMAL HIGH (ref 15–41)
Albumin: 4.4 g/dL (ref 3.5–5.0)
Alkaline Phosphatase: 50 U/L (ref 38–126)
Anion gap: 7 (ref 5–15)
BUN: 17 mg/dL (ref 8–23)
CO2: 27 mmol/L (ref 22–32)
Calcium: 9.3 mg/dL (ref 8.9–10.3)
Chloride: 105 mmol/L (ref 98–111)
Creatinine: 0.61 mg/dL (ref 0.44–1.00)
GFR, Estimated: >60 mL/min (ref >60)
Glucose, Bld: 106 mg/dL — ABNORMAL HIGH (ref 70–99)
Potassium: 3.5 mmol/L (ref 3.5–5.1)
Sodium: 139 mmol/L (ref 135–145)
Total Bilirubin: 0.5 mg/dL (ref 0.0–1.2)
Total Protein: 7.1 g/dL (ref 6.5–8.1)

## 2024-01-26 LAB — LACTATE DEHYDROGENASE: LDH: 162 U/L (ref 98–192)

## 2024-01-26 NOTE — Assessment & Plan Note (Signed)
 Chronic thrombocytosis with platelet counts ranging from 551,000 to 685,000 since August 2024.    On her consultation with us  on 10/15/2023, labs showed persistent thrombocytosis but platelet count was slightly better at 588,000.  White count 8800 with normal differential.  Hemoglobin normal at 12.2, MCV 91.2.  CMP unremarkable.  LDH, Sed rate, CRP were all within normal limits.  Iron studies showed no evidence of iron deficiency.  Ferritin was borderline low at 11.   We pursued JAK2 mutation analysis with reflex testing to include CALR, MPL, exon 12-15 mutations to rule out primary myeloproliferative neoplasm.  JAK2 mutation was negative.  However reflex testing showed that she has MPL mutation positivity. We also checked BCR/ABL 1 and it was negative.  Since patient was traveling out of country from 10/28/2023 until 11/14/2023, bone marrow biopsy was obtained on 11/26/2023 once she returned.    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  Megakaryocytes were significantly increased in number with clustering and abnormal lobulation.  Conventional cytogenetics showed normal female karyotype, XX.  IPSET thrombosis score of 1, low risk.    Discussed the nature of essential thrombocythemia as a myeloproliferative neoplasm. Emphasized the importance of controlling cell division to prevent bone marrow exhaustion and scarring. Discussed potential symptoms to monitor, including unexplained infections, fevers, chills, night sweats, unusual bleeding, decreased appetite, low energy, and shortness of breath.  Platelet count was increased at 647,000 on 12/08/2023.  White count and hemoglobin remain within normal limits.  On 12/08/2023, we started her on cytoreductive therapy with hydroxyurea  with goal to maintain platelet count below 400,000.  Initially we started her on hydroxyurea  500 mg once daily.   She has been compliant with hydroxyurea   500 mg once daily and tolerating it well.  No major side effects.  Platelet count is better at 425,000 today.  Hemoglobin 11.9, white count normal.  Will continue current dose of hydroxyurea  and monitor labs closely.  She was advised to continue aspirin 81 mg daily to prevent vasomotor symptoms and for clot prevention.  RTC in 6 weeks for follow-up with repeat labs.  If stable labs, will slowly space out clinic intervals.

## 2024-01-26 NOTE — Assessment & Plan Note (Signed)
 This has resolved.  Potassium is 3.5 today. No immediate need for potassium supplementation. Hydrea  does not typically affect potassium levels. Encouraged dietary intake of potassium-rich foods like bananas and oranges.

## 2024-01-26 NOTE — Progress Notes (Signed)
 [facility]  HEMATOLOGY CLINIC PROGRESS NOTE  PATIENT NAME: Yolanda Nicholson   MR#: [id number] DOB: [birth year]  Patient Care Team: Loreli Elsie JONETTA Mickey., MD as PCP - General (Internal Medicine)  Date of visit: 01/26/2024   ASSESSMENT & PLAN:   Yolanda Nicholson is a 78 y.o. lady with a past medical history of noncritical coronary artery disease, osteoporosis, diverticulitis, dyslipidemia, macular degeneration, was referred to our service in March 2025 for evaluation of thrombocytosis. MPL mutation positive. BM biopsy on 11/26/23 consistent with Essential Thrombocytosis.    Essential thrombocytosis (HCC) Chronic thrombocytosis with platelet counts ranging from 551,000 to 685,000 since August 2024.    On her consultation with us  on 10/15/2023, labs showed persistent thrombocytosis but platelet count was slightly better at 588,000.  White count 8800 with normal differential.  Hemoglobin normal at 12.2, MCV 91.2.  CMP unremarkable.  LDH, Sed rate, CRP were all within normal limits.  Iron studies showed no evidence of iron deficiency.  Ferritin was borderline low at 11.   We pursued JAK2 mutation analysis with reflex testing to include CALR, MPL, exon 12-15 mutations to rule out primary myeloproliferative neoplasm.  JAK2 mutation was negative.  However reflex testing showed that she has MPL mutation positivity. We also checked BCR/ABL 1 and it was negative.  Since patient was traveling out of country from 10/28/2023 until 11/14/2023, bone marrow biopsy was obtained on 11/26/2023 once she returned.    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  Megakaryocytes were significantly increased in number with clustering and abnormal lobulation.  Conventional cytogenetics showed normal female karyotype, XX.  IPSET thrombosis score of 1, low risk.    Discussed the nature of essential thrombocythemia as a  myeloproliferative neoplasm. Emphasized the importance of controlling cell division to prevent bone marrow exhaustion and scarring. Discussed potential symptoms to monitor, including unexplained infections, fevers, chills, night sweats, unusual bleeding, decreased appetite, low energy, and shortness of breath.  Platelet count was increased at 647,000 on 12/08/2023.  White count and hemoglobin remain within normal limits.  On 12/08/2023, we started her on cytoreductive therapy with hydroxyurea  with goal to maintain platelet count below 400,000.  Initially we started her on hydroxyurea  500 mg once daily.   She has been compliant with hydroxyurea  500 mg once daily and tolerating it well.  No major side effects.  Platelet count is better at 425,000 today.  Hemoglobin 11.9, white count normal.  Will continue current dose of hydroxyurea  and monitor labs closely.  She was advised to continue aspirin 81 mg daily to prevent vasomotor symptoms and for clot prevention.  RTC in 6 weeks for follow-up with repeat labs.  If stable labs, will slowly space out clinic intervals.  Hypokalemia This has resolved.  Potassium is 3.5 today. No immediate need for potassium supplementation. Hydrea  does not typically affect potassium levels. Encouraged dietary intake of potassium-rich foods like bananas and oranges.   Elevated liver enzymes Mildly elevated AST at 49, slightly above the normal cutoff of 45. No Tylenol  use reported. Limited alcohol  consumption, specifically red wine, is likely contributing to the elevation. No immediate concern at this level, but advised to monitor if levels continue to rise. - Limit alcohol  consumption to one drink or less  I spent a total of 27 minutes during this encounter with the patient including review of chart and various tests results, discussions about plan of care and coordination of care plan.  I  reviewed lab results and outside records for this visit and discussed relevant  results with the patient. Diagnosis, plan of care and treatment options were also discussed in detail with the patient. Opportunity provided to ask questions and answers provided to her apparent satisfaction. Provided instructions to call our clinic with any problems, questions or concerns prior to return visit. I recommended to continue follow-up with PCP and sub-specialists. She verbalized understanding and agreed with the plan. No barriers to learning was detected.  Yolanda Patten, MD  01/26/2024 1:50 PM  [facility] - A DEPT OF [facility] [address] [place] [address] Dept: [phone] Dept Fax: [phone]   CHIEF COMPLAINT/ REASON FOR VISIT:  Follow up for Essential thrombocytosis, MPL mutation positive.  Confirmed on bone marrow biopsy on 11/26/2023.  INTERVAL HISTORY:  Discussed the use of AI scribe software for clinical note transcription with the patient, who gave verbal consent to proceed.  History of Present Illness  [name] is a 78 year old female with essential thrombocytosis who presents for a follow-up visit.  She experiences dizziness a couple of hours after taking her medication, which resolves on its own. No recent nausea. Her platelet counts have decreased from 647 to 425. White blood cell count is normal, and hemoglobin is 11.9. She is currently taking aspirin and hydroxyurea , one capsule a day.  Her AST is slightly elevated at 49, which may be related to her alcohol  consumption. She consumes red wine in moderation, preferring to drink it in a tumbler rather than a wine glass. Potassium levels have improved from 3.3 to 3.5, attributed to her regular consumption of bananas.  No unexplained infections, fevers, chills, night sweats, unusual bleeding, decreased appetite, or shortness of breath with minimal exertion. She keeps up with her mammograms and had a colonoscopy three  years ago. She also uses MyChart to track her blood work results.    SUMMARY OF HEMATOLOGIC HISTORY:  Routine labs at her PCPs office on 08/30/2023 showed platelet count of 682,000.  White count was 7800 with normal differential, hemoglobin 12.1.  Previously labs in September 2024 showed platelet count of 551,000 and labs in August 2024 showed platelet count of 685,000.  Given persistent thrombocytosis, referral was sent to us  for further evaluation.   In August 2024, the patient experienced a gastrointestinal illness that she suspected was C. diff, but tests were negative. This illness was followed by a bout of diverticulitis, a condition the patient has experienced three times over the past few years. Since this time, the patient has reported a lack of energy and fatigue, which is unusual for her.   The patient is currently on cholesterol medication and takes aspirin regularly.   She did not have prior splenectomy. She had no prior history or diagnosis of cancer. Her age appropriate screening programs are up-to-date. Patient has never been diagnosed with thrombotic events.   Chronic thrombocytosis with platelet counts ranging from 551,000 to 685,000 since August 2024.    Differential diagnosis includes essential thrombocytosis (ET), reactive thrombocytosis due to stress, infection, iron deficiency, or thyroid  abnormalities.    On her consultation with us  on 10/15/2023, labs showed persistent thrombocytosis but platelet count was slightly better at 588,000.  White count 8800 with normal differential.  Hemoglobin normal at 12.2, MCV 91.2.  CMP unremarkable.  LDH, Sed rate, CRP were all within normal limits.  Iron studies showed no evidence of iron  deficiency.  Ferritin was borderline low at 11.   We pursued JAK2 mutation analysis with reflex testing to include CALR, MPL, exon 12-15 mutations to rule out primary myeloproliferative neoplasm.  JAK2 mutation was negative.  However reflex testing showed  that she has MPL mutation positivity. We also checked BCR/ABL 1 and it was negative.  Since patient was traveling out of country from 10/28/2023 until 11/14/2023, bone marrow biopsy was obtained on 11/26/2023 once she returned.    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  Megakaryocytes were significantly increased in number with clustering and abnormal lobulation.  Conventional cytogenetics showed normal female karyotype, XX.  IPSET thrombosis score of 1, low risk.    On 12/08/2023, we started her on cytoreductive therapy with hydroxyurea  500 mg daily with goal to maintain platelet count below 400,000.  She was advised to continue aspirin 81 mg daily to prevent vasomotor symptoms.  I have reviewed the past medical history, past surgical history, social history and family history with the patient and they are unchanged from previous note.  ALLERGIES: She is allergic to atorvastatin, betadine [povidone iodine], penicillins, and sulfa antibiotics.  MEDICATIONS:  Current Outpatient Medications  Medication Sig Dispense Refill   alendronate (FOSAMAX) 70 MG tablet Take 70 mg by mouth once a week.     aspirin EC 81 MG tablet Take 162 mg by mouth daily.     Cholecalciferol (VITAMIN D) 2000 UNITS tablet Take 2,000 Units by mouth daily.      ezetimibe (ZETIA) 10 MG tablet Take 10 mg by mouth daily.     hydroxyurea  (HYDREA ) 500 MG capsule Take 1 capsule (500 mg total) by mouth daily. May take with food to minimize GI side effects. 30 capsule 3   Multiple Vitamins-Minerals (PRESERVISION AREDS 2 PO) Take by mouth.     Propylene Glycol (SYSTANE BALANCE OP) Apply to eye.     rosuvastatin  (CRESTOR ) 20 MG tablet Take 20 mg by mouth daily.     tobramycin (TOBREX) 0.3 % ophthalmic solution      No current facility-administered medications for this visit.     REVIEW OF SYSTEMS:    Review of Systems  Respiratory:  Shortness of  breath: on exertion.   Gastrointestinal:  Positive for nausea (mild, after eating).    All other pertinent systems were reviewed with the patient and are negative.  PHYSICAL EXAMINATION:   Onc Performance Status - 01/26/24 0844       ECOG Perf Status   ECOG Perf Status Fully active, able to carry on all pre-disease performance without restriction      KPS SCALE   KPS % SCORE Able to carry on normal activity, minor s/s of disease          Vitals:   01/26/24 0819  BP: 121/61  Pulse: 67  Resp: 17  Temp: 98.4 F (36.9 C)  SpO2: 99%   Filed Weights   01/26/24 0819  Weight: 121 lb 1.6 oz (54.9 kg)    Physical Exam Constitutional:      General: She is not in acute distress.    Appearance: Normal appearance.  HENT:     Head: Normocephalic and atraumatic.  Eyes:     General: No scleral icterus.    Conjunctiva/sclera: Conjunctivae normal.  Cardiovascular:     Rate and Rhythm: Normal rate and regular rhythm.  Pulmonary:     Effort: Pulmonary effort is normal.  Abdominal:     General:  There is no distension.  Musculoskeletal:     Right lower leg: No edema.     Left lower leg: No edema.  Neurological:     General: No focal deficit present.     Mental Status: She is alert and oriented to person, place, and time.  Psychiatric:        Mood and Affect: Mood normal.        Behavior: Behavior normal.        Thought Content: Thought content normal.      LABORATORY DATA:   I have reviewed the data as listed.  Results for orders placed or performed in visit on 01/26/24  Lactate dehydrogenase  Result Value Ref Range   LDH 162 98 - 192 U/L  CMP (Cancer Center only)  Result Value Ref Range   Sodium 139 135 - 145 mmol/L   Potassium 3.5 3.5 - 5.1 mmol/L   Chloride 105 98 - 111 mmol/L   CO2 27 22 - 32 mmol/L   Glucose, Bld 106 (H) 70 - 99 mg/dL   BUN 17 8 - 23 mg/dL   Creatinine 9.38 9.55 - 1.00 mg/dL   Calcium  9.3 8.9 - 10.3 mg/dL   Total Protein 7.1 6.5 - 8.1  g/dL   Albumin 4.4 3.5 - 5.0 g/dL   AST 49 (H) 15 - 41 U/L   ALT 45 (H) 0 - 44 U/L   Alkaline Phosphatase 50 38 - 126 U/L   Total Bilirubin 0.5 0.0 - 1.2 mg/dL   GFR, Estimated >39 >39 mL/min   Anion gap 7 5 - 15  CBC with Differential (Cancer Center Only)  Result Value Ref Range   WBC Count 7.6 4.0 - 10.5 K/uL   RBC 3.96 3.87 - 5.11 MIL/uL   Hemoglobin 11.9 (L) 12.0 - 15.0 g/dL   HCT 63.5 63.9 - 53.9 %   MCV 91.9 80.0 - 100.0 fL   MCH 30.1 26.0 - 34.0 pg   MCHC 32.7 30.0 - 36.0 g/dL   RDW 83.2 (H) 88.4 - 84.4 %   Platelet Count 425 (H) 150 - 400 K/uL   nRBC 0.0 0.0 - 0.2 %   Neutrophils Relative % 69 %   Neutro Abs 5.2 1.7 - 7.7 K/uL   Lymphocytes Relative 18 %   Lymphs Abs 1.4 0.7 - 4.0 K/uL   Monocytes Relative 10 %   Monocytes Absolute 0.8 0.1 - 1.0 K/uL   Eosinophils Relative 2 %   Eosinophils Absolute 0.1 0.0 - 0.5 K/uL   Basophils Relative 1 %   Basophils Absolute 0.1 0.0 - 0.1 K/uL   Immature Granulocytes 0 %   Abs Immature Granulocytes 0.02 0.00 - 0.07 K/uL     RADIOGRAPHIC STUDIES:  No recent pertinent imaging available to review.   Orders Placed This Encounter  Procedures   CBC with Differential (Cancer Center Only)    Standing Status:   Standing    Number of Occurrences:   6    Expiration Date:   01/25/2025   CMP (Cancer Center only)    Standing Status:   Standing    Number of Occurrences:   6    Expiration Date:   01/25/2025   Lactate dehydrogenase    Standing Status:   Standing    Number of Occurrences:   6    Expiration Date:   01/25/2025     Future Appointments  Date Time Provider Department Center  03/09/2024  8:15 AM [facility] None  03/09/2024  8:45 AM Ai Sonnenfeld, Chinita, MD [facility] None     This document was completed utilizing speech recognition software. Grammatical errors, random word insertions, pronoun errors, and incomplete sentences are an occasional consequence of this system due to software limitations, ambient  noise, and hardware issues. Any formal questions or concerns about the content, text or information contained within the body of this dictation should be directly addressed to the provider for clarification.

## 2024-01-31 DIAGNOSIS — L738 Other specified follicular disorders: Secondary | ICD-10-CM | POA: Diagnosis not present

## 2024-01-31 DIAGNOSIS — L821 Other seborrheic keratosis: Secondary | ICD-10-CM | POA: Diagnosis not present

## 2024-01-31 DIAGNOSIS — L282 Other prurigo: Secondary | ICD-10-CM | POA: Diagnosis not present

## 2024-01-31 DIAGNOSIS — L570 Actinic keratosis: Secondary | ICD-10-CM | POA: Diagnosis not present

## 2024-02-02 DIAGNOSIS — H524 Presbyopia: Secondary | ICD-10-CM | POA: Diagnosis not present

## 2024-02-02 DIAGNOSIS — H52203 Unspecified astigmatism, bilateral: Secondary | ICD-10-CM | POA: Diagnosis not present

## 2024-02-02 DIAGNOSIS — H26493 Other secondary cataract, bilateral: Secondary | ICD-10-CM | POA: Diagnosis not present

## 2024-02-02 DIAGNOSIS — H43813 Vitreous degeneration, bilateral: Secondary | ICD-10-CM | POA: Diagnosis not present

## 2024-02-02 DIAGNOSIS — H353211 Exudative age-related macular degeneration, right eye, with active choroidal neovascularization: Secondary | ICD-10-CM | POA: Diagnosis not present

## 2024-02-02 DIAGNOSIS — H04123 Dry eye syndrome of bilateral lacrimal glands: Secondary | ICD-10-CM | POA: Diagnosis not present

## 2024-03-09 ENCOUNTER — Inpatient Hospital Stay: Admitting: Oncology

## 2024-03-09 ENCOUNTER — Inpatient Hospital Stay: Attending: Oncology

## 2024-03-09 ENCOUNTER — Encounter: Payer: Self-pay | Admitting: Oncology

## 2024-03-09 VITALS — BP 112/62 | HR 67 | Temp 98.0°F | Resp 13 | Wt 122.2 lb

## 2024-03-09 DIAGNOSIS — Z7964 Long term (current) use of myelosuppressive agent: Secondary | ICD-10-CM | POA: Diagnosis not present

## 2024-03-09 DIAGNOSIS — Z7982 Long term (current) use of aspirin: Secondary | ICD-10-CM | POA: Insufficient documentation

## 2024-03-09 DIAGNOSIS — Z882 Allergy status to sulfonamides status: Secondary | ICD-10-CM | POA: Diagnosis not present

## 2024-03-09 DIAGNOSIS — Z79899 Other long term (current) drug therapy: Secondary | ICD-10-CM | POA: Diagnosis not present

## 2024-03-09 DIAGNOSIS — D75839 Thrombocytosis, unspecified: Secondary | ICD-10-CM | POA: Insufficient documentation

## 2024-03-09 DIAGNOSIS — E876 Hypokalemia: Secondary | ICD-10-CM | POA: Insufficient documentation

## 2024-03-09 DIAGNOSIS — D473 Essential (hemorrhagic) thrombocythemia: Secondary | ICD-10-CM | POA: Diagnosis not present

## 2024-03-09 DIAGNOSIS — Z880 Allergy status to penicillin: Secondary | ICD-10-CM | POA: Diagnosis not present

## 2024-03-09 LAB — CMP (CANCER CENTER ONLY)
ALT: 32 U/L (ref 0–44)
AST: 34 U/L (ref 15–41)
Albumin: 4.4 g/dL (ref 3.5–5.0)
Alkaline Phosphatase: 51 U/L (ref 38–126)
Anion gap: 6 (ref 5–15)
BUN: 15 mg/dL (ref 8–23)
CO2: 28 mmol/L (ref 22–32)
Calcium: 8.9 mg/dL (ref 8.9–10.3)
Chloride: 105 mmol/L (ref 98–111)
Creatinine: 0.56 mg/dL (ref 0.44–1.00)
GFR, Estimated: >60 mL/min (ref >60)
Glucose, Bld: 106 mg/dL — ABNORMAL HIGH (ref 70–99)
Potassium: 3.7 mmol/L (ref 3.5–5.1)
Sodium: 139 mmol/L (ref 135–145)
Total Bilirubin: 0.5 mg/dL (ref 0.0–1.2)
Total Protein: 6.7 g/dL (ref 6.5–8.1)

## 2024-03-09 LAB — CBC WITH DIFFERENTIAL (CANCER CENTER ONLY)
Abs Immature Granulocytes: 0.01 K/uL (ref 0.00–0.07)
Basophils Absolute: 0.1 K/uL (ref 0.0–0.1)
Basophils Relative: 1 %
Eosinophils Absolute: 0.2 K/uL (ref 0.0–0.5)
Eosinophils Relative: 2 %
HCT: 35.8 % — ABNORMAL LOW (ref 36.0–46.0)
Hemoglobin: 11.9 g/dL — ABNORMAL LOW (ref 12.0–15.0)
Immature Granulocytes: 0 %
Lymphocytes Relative: 18 %
Lymphs Abs: 1.2 K/uL (ref 0.7–4.0)
MCH: 32 pg (ref 26.0–34.0)
MCHC: 33.2 g/dL (ref 30.0–36.0)
MCV: 96.2 fL (ref 80.0–100.0)
Monocytes Absolute: 0.8 K/uL (ref 0.1–1.0)
Monocytes Relative: 11 %
Neutro Abs: 4.7 K/uL (ref 1.7–7.7)
Neutrophils Relative %: 68 %
Platelet Count: 420 K/uL — ABNORMAL HIGH (ref 150–400)
RBC: 3.72 MIL/uL — ABNORMAL LOW (ref 3.87–5.11)
RDW: 16.1 % — ABNORMAL HIGH (ref 11.5–15.5)
WBC Count: 6.9 K/uL (ref 4.0–10.5)
nRBC: 0 % (ref 0.0–0.2)

## 2024-03-09 LAB — LACTATE DEHYDROGENASE: LDH: 150 U/L (ref 98–192)

## 2024-03-09 NOTE — Assessment & Plan Note (Signed)
 Chronic thrombocytosis with platelet counts ranging from 551,000 to 685,000 since August 2024.    On her consultation with us  on 10/15/2023, labs showed persistent thrombocytosis but platelet count was slightly better at 588,000.  White count 8800 with normal differential.  Hemoglobin normal at 12.2, MCV 91.2.  CMP unremarkable.  LDH, Sed rate, CRP were all within normal limits.  Iron studies showed no evidence of iron deficiency.  Ferritin was borderline low at 11.   We pursued JAK2 mutation analysis with reflex testing to include CALR, MPL, exon 12-15 mutations to rule out primary myeloproliferative neoplasm.  JAK2 mutation was negative.  However reflex testing showed that she has MPL mutation positivity. We also checked BCR/ABL 1 and it was negative.  Since patient was traveling out of country from 10/28/2023 until 11/14/2023, bone marrow biopsy was obtained on 11/26/2023 once she returned.    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  Megakaryocytes were significantly increased in number with clustering and abnormal lobulation.  Conventional cytogenetics showed normal female karyotype, XX.  IPSET thrombosis score of 1, low risk.    Discussed the nature of essential thrombocythemia as a myeloproliferative neoplasm. Emphasized the importance of controlling cell division to prevent bone marrow exhaustion and scarring. Discussed potential symptoms to monitor, including unexplained infections, fevers, chills, night sweats, unusual bleeding, decreased appetite, low energy, and shortness of breath.  Platelet count was increased at 647,000 on 12/08/2023.  White count and hemoglobin remain within normal limits.  On 12/08/2023, we started her on cytoreductive therapy with hydroxyurea  with goal to maintain platelet count below 400,000.  Initially we started her on hydroxyurea  500 mg once daily.   She has been compliant with hydroxyurea   500 mg once daily and tolerating it well.  No major side effects.  Platelet count is better at 420,000 today.  Hemoglobin 11.9, white count normal.  Will continue current dose of hydroxyurea  and monitor labs closely. The current treatment regimen is effective, and there is no need to adjust the dosage of Hydrea  as long as other blood counts remain stable.  She was advised to continue aspirin 81 mg daily to prevent vasomotor symptoms and for clot prevention.  RTC in 8 weeks for follow-up with repeat labs.  If stable labs, will slowly space out clinic intervals.

## 2024-03-09 NOTE — Progress Notes (Signed)
 [facility]  HEMATOLOGY CLINIC PROGRESS NOTE  PATIENT NAME: Yolanda Nicholson   MR#: [id number] DOB: 1946-01-23  Patient Care Team: Loreli Elsie JONETTA Mickey., MD as PCP - General (Internal Medicine)  Date of visit: 03/09/2024   ASSESSMENT & PLAN:   Yolanda Nicholson is a 78 y.o. lady with a past medical history of noncritical coronary artery disease, osteoporosis, diverticulitis, dyslipidemia, macular degeneration, was referred to our service in March 2025 for evaluation of thrombocytosis. MPL mutation positive. BM biopsy on 11/26/23 consistent with Essential Thrombocytosis.    Essential thrombocytosis (HCC) Chronic thrombocytosis with platelet counts ranging from 551,000 to 685,000 since August 2024.    On her consultation with us  on 10/15/2023, labs showed persistent thrombocytosis but platelet count was slightly better at 588,000.  White count 8800 with normal differential.  Hemoglobin normal at 12.2, MCV 91.2.  CMP unremarkable.  LDH, Sed rate, CRP were all within normal limits.  Iron studies showed no evidence of iron deficiency.  Ferritin was borderline low at 11.   We pursued JAK2 mutation analysis with reflex testing to include CALR, MPL, exon 12-15 mutations to rule out primary myeloproliferative neoplasm.  JAK2 mutation was negative.  However reflex testing showed that she has MPL mutation positivity. We also checked BCR/ABL 1 and it was negative.  Since patient was traveling out of country from 10/28/2023 until 11/14/2023, bone marrow biopsy was obtained on 11/26/2023 once she returned.    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  Megakaryocytes were significantly increased in number with clustering and abnormal lobulation.  Conventional cytogenetics showed normal female karyotype, XX.  IPSET thrombosis score of 1, low risk.    Discussed the nature of essential thrombocythemia as a  myeloproliferative neoplasm. Emphasized the importance of controlling cell division to prevent bone marrow exhaustion and scarring. Discussed potential symptoms to monitor, including unexplained infections, fevers, chills, night sweats, unusual bleeding, decreased appetite, low energy, and shortness of breath.  Platelet count was increased at 647,000 on 12/08/2023.  White count and hemoglobin remain within normal limits.  On 12/08/2023, we started her on cytoreductive therapy with hydroxyurea with goal to maintain platelet count below 400,000.  Initially we started her on hydroxyurea 500 mg once daily.   She has been compliant with hydroxyurea 500 mg once daily and tolerating it well.  No major side effects.  Platelet count is better at 420,000 today.  Hemoglobin 11.9, white count normal.  Will continue current dose of hydroxyurea and monitor labs closely. The current treatment regimen is effective, and there is no need to adjust the dosage of Hydrea as long as other blood counts remain stable.  She was advised to continue aspirin 81 mg daily to prevent vasomotor symptoms and for clot prevention.  RTC in 8 weeks for follow-up with repeat labs.  If stable labs, will slowly space out clinic intervals.  Hypokalemia This has resolved.  Potassium is 3.7 today. No immediate need for potassium supplementation. Hydrea does not typically affect potassium levels. Encouraged dietary intake of potassium-rich foods like bananas and oranges.   Hx of elevated liver enzymes In July 2025, mildly elevated AST at 49, slightly above the normal cutoff of 45. No Tylenol use reported. Limited alcohol consumption, specifically red wine, is likely contributing to the elevation.  LFTs are normal today. - Limit alcohol consumption to one drink or less.   I spent a total of 27 minutes during this encounter with the  patient including review of chart and various tests results, discussions about plan of care and coordination of  care plan.  I reviewed lab results and outside records for this visit and discussed relevant results with the patient. Diagnosis, plan of care and treatment options were also discussed in detail with the patient. Opportunity provided to ask questions and answers provided to her apparent satisfaction. Provided instructions to call our clinic with any problems, questions or concerns prior to return visit. I recommended to continue follow-up with PCP and sub-specialists. She verbalized understanding and agreed with the plan. No barriers to learning was detected.  Yolanda Patten, MD  03/09/2024 9:37 AM  [facility] - A DEPT OF [facility] [address] [place] [address] Dept: [phone] Dept Fax: [phone]   CHIEF COMPLAINT/ REASON FOR VISIT:  Follow up for Essential thrombocytosis, MPL mutation positive.  Confirmed on bone marrow biopsy on 11/26/2023.  INTERVAL HISTORY:  Discussed the use of AI scribe software for clinical note transcription with the patient, who gave verbal consent to proceed.  History of Present Illness  Yolanda Nicholson is a 78 year old female with essential thrombocythemia who presents for follow-up of her platelet count and medication management.  She is being monitored for essential thrombocythemia, with her platelet count showing a gradual decrease, currently at 420,000, down from 425,000. She is on a regimen of Hydrea  once daily and a baby aspirin. No significant side effects are reported, although she experiences occasional nausea and reduced energy levels, which are not concerning to her.  Recent laboratory results indicate a normal white blood cell count of 6,900 and a hemoglobin level of 11.9, which is slightly below the normal range. Her potassium level was 3.7. Recent laboratory results show AST decreased from 49 to 34 and ALT from 45 to 32.  No significant nausea is  reported, and her current energy levels are slightly reduced but not concerning.    SUMMARY OF HEMATOLOGIC HISTORY:  Routine labs at her PCPs office on 08/30/2023 showed platelet count of 682,000.  White count was 7800 with normal differential, hemoglobin 12.1.  Previously labs in September 2024 showed platelet count of 551,000 and labs in August 2024 showed platelet count of 685,000.  Given persistent thrombocytosis, referral was sent to us  for further evaluation.   In August 2024, the patient experienced a gastrointestinal illness that she suspected was C. diff, but tests were negative. This illness was followed by a bout of diverticulitis, a condition the patient has experienced three times over the past few years. Since this time, the patient has reported a lack of energy and fatigue, which is unusual for her.   The patient is currently on cholesterol medication and takes aspirin regularly.   She did not have prior splenectomy. She had no prior history or diagnosis of cancer. Her age appropriate screening programs are up-to-date. Patient has never been diagnosed with thrombotic events.   Chronic thrombocytosis with platelet counts ranging from 551,000 to 685,000 since August 2024.    Differential diagnosis includes essential thrombocytosis (ET), reactive thrombocytosis due to stress, infection, iron deficiency, or thyroid  abnormalities.    On her consultation with us  on 10/15/2023, labs showed persistent thrombocytosis but platelet count was slightly better at 588,000.  White count 8800 with normal differential.  Hemoglobin normal at 12.2, MCV 91.2.  CMP unremarkable.  LDH, Sed rate, CRP were all within normal limits.  Iron studies  showed no evidence of iron deficiency.  Ferritin was borderline low at 11.   We pursued JAK2 mutation analysis with reflex testing to include CALR, MPL, exon 12-15 mutations to rule out primary myeloproliferative neoplasm.  JAK2 mutation was negative.  However reflex  testing showed that she has MPL mutation positivity. We also checked BCR/ABL 1 and it was negative.  Since patient was traveling out of country from 10/28/2023 until 11/14/2023, bone marrow biopsy was obtained on 11/26/2023 once she returned.    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  Megakaryocytes were significantly increased in number with clustering and abnormal lobulation.  Conventional cytogenetics showed normal female karyotype, XX.  IPSET thrombosis score of 1, low risk.    On 12/08/2023, we started her on cytoreductive therapy with hydroxyurea 500 mg daily with goal to maintain platelet count below 400,000.  She was advised to continue aspirin 81 mg daily to prevent vasomotor symptoms.  I have reviewed the past medical history, past surgical history, social history and family history with the patient and they are unchanged from previous note.  ALLERGIES: She is allergic to atorvastatin, betadine [povidone iodine], penicillins, and sulfa antibiotics.  MEDICATIONS:  Current Outpatient Medications  Medication Sig Dispense Refill   alendronate (FOSAMAX) 70 MG tablet Take 70 mg by mouth once a week.     aspirin EC 81 MG tablet Take 162 mg by mouth daily.     Cholecalciferol (VITAMIN D) 2000 UNITS tablet Take 2,000 Units by mouth daily.      ezetimibe (ZETIA) 10 MG tablet Take 10 mg by mouth daily.     hydroxyurea (HYDREA) 500 MG capsule Take 1 capsule (500 mg total) by mouth daily. May take with food to minimize GI side effects. 30 capsule 3   Multiple Vitamins-Minerals (PRESERVISION AREDS 2 PO) Take by mouth.     Propylene Glycol (SYSTANE BALANCE OP) Apply to eye.     rosuvastatin (CRESTOR) 20 MG tablet Take 20 mg by mouth daily.     tobramycin (TOBREX) 0.3 % ophthalmic solution      No current facility-administered medications for this visit.     REVIEW OF SYSTEMS:    Review of Systems  Respiratory:   Shortness of breath: on exertion.   Gastrointestinal:  Positive for nausea (mild, after eating).    All other pertinent systems were reviewed with the patient and are negative.  PHYSICAL EXAMINATION:   Onc Performance Status - 03/09/24 0853       ECOG Perf Status   ECOG Perf Status Fully active, able to carry on all pre-disease performance without restriction      KPS SCALE   KPS % SCORE Able to carry on normal activity, minor s/s of disease          Vitals:   03/09/24 0848  BP: 112/62  Pulse: 67  Resp: 13  Temp: 98 F (36.7 C)  SpO2: 100%   Filed Weights   03/09/24 0848  Weight: 122 lb 3.2 oz (55.4 kg)    Physical Exam Constitutional:      General: She is not in acute distress.    Appearance: Normal appearance.  HENT:     Head: Normocephalic and atraumatic.  Eyes:     General: No scleral icterus.    Conjunctiva/sclera: Conjunctivae normal.  Cardiovascular:     Rate and Rhythm: Normal rate and regular rhythm.  Pulmonary:     Effort: Pulmonary effort is normal.  Abdominal:  General: There is no distension.  Musculoskeletal:     Right lower leg: No edema.     Left lower leg: No edema.  Neurological:     General: No focal deficit present.     Mental Status: She is alert and oriented to person, place, and time.  Psychiatric:        Mood and Affect: Mood normal.        Behavior: Behavior normal.        Thought Content: Thought content normal.      LABORATORY DATA:   I have reviewed the data as listed.  Results for orders placed or performed in visit on 03/09/24  Lactate dehydrogenase  Result Value Ref Range   LDH 150 98 - 192 U/L  CMP (Cancer Center only)  Result Value Ref Range   Sodium 139 135 - 145 mmol/L   Potassium 3.7 3.5 - 5.1 mmol/L   Chloride 105 98 - 111 mmol/L   CO2 28 22 - 32 mmol/L   Glucose, Bld 106 (H) 70 - 99 mg/dL   BUN 15 8 - 23 mg/dL   Creatinine 9.43 9.55 - 1.00 mg/dL   Calcium  8.9 8.9 - 10.3 mg/dL   Total Protein 6.7  6.5 - 8.1 g/dL   Albumin 4.4 3.5 - 5.0 g/dL   AST 34 15 - 41 U/L   ALT 32 0 - 44 U/L   Alkaline Phosphatase 51 38 - 126 U/L   Total Bilirubin 0.5 0.0 - 1.2 mg/dL   GFR, Estimated >39 >39 mL/min   Anion gap 6 5 - 15  CBC with Differential (Cancer Center Only)  Result Value Ref Range   WBC Count 6.9 4.0 - 10.5 K/uL   RBC 3.72 (L) 3.87 - 5.11 MIL/uL   Hemoglobin 11.9 (L) 12.0 - 15.0 g/dL   HCT 64.1 (L) 63.9 - 53.9 %   MCV 96.2 80.0 - 100.0 fL   MCH 32.0 26.0 - 34.0 pg   MCHC 33.2 30.0 - 36.0 g/dL   RDW 83.8 (H) 88.4 - 84.4 %   Platelet Count 420 (H) 150 - 400 K/uL   nRBC 0.0 0.0 - 0.2 %   Neutrophils Relative % 68 %   Neutro Abs 4.7 1.7 - 7.7 K/uL   Lymphocytes Relative 18 %   Lymphs Abs 1.2 0.7 - 4.0 K/uL   Monocytes Relative 11 %   Monocytes Absolute 0.8 0.1 - 1.0 K/uL   Eosinophils Relative 2 %   Eosinophils Absolute 0.2 0.0 - 0.5 K/uL   Basophils Relative 1 %   Basophils Absolute 0.1 0.0 - 0.1 K/uL   Immature Granulocytes 0 %   Abs Immature Granulocytes 0.01 0.00 - 0.07 K/uL     RADIOGRAPHIC STUDIES:  No recent pertinent imaging available to review.   Orders Placed This Encounter  Procedures   CBC with Differential (Cancer Center Only)    Standing Status:   Standing    Number of Occurrences:   6    Expiration Date:   03/09/2025   CMP (Cancer Center only)    Standing Status:   Standing    Number of Occurrences:   6    Expiration Date:   03/09/2025   Lactate dehydrogenase    Standing Status:   Standing    Number of Occurrences:   6    Expiration Date:   03/09/2025     Future Appointments  Date Time Provider Department Center  05/04/2024  8:00 AM [facility]  None  05/04/2024  8:30 AM Constantin Hillery, [place], MD [facility] None     This document was completed utilizing speech recognition software. Grammatical errors, random word insertions, pronoun errors, and incomplete sentences are an occasional consequence of this system due to software limitations,  ambient noise, and hardware issues. Any formal questions or concerns about the content, text or information contained within the body of this dictation should be directly addressed to the provider for clarification.

## 2024-03-09 NOTE — Assessment & Plan Note (Signed)
 This has resolved.  Potassium is 3.7 today. No immediate need for potassium supplementation. Hydrea  does not typically affect potassium levels. Encouraged dietary intake of potassium-rich foods like bananas and oranges.

## 2024-03-30 ENCOUNTER — Other Ambulatory Visit: Payer: Self-pay | Admitting: Oncology

## 2024-03-30 DIAGNOSIS — D473 Essential (hemorrhagic) thrombocythemia: Secondary | ICD-10-CM

## 2024-04-05 DIAGNOSIS — L821 Other seborrheic keratosis: Secondary | ICD-10-CM | POA: Diagnosis not present

## 2024-04-05 DIAGNOSIS — D224 Melanocytic nevi of scalp and neck: Secondary | ICD-10-CM | POA: Diagnosis not present

## 2024-04-05 DIAGNOSIS — L720 Epidermal cyst: Secondary | ICD-10-CM | POA: Diagnosis not present

## 2024-04-05 DIAGNOSIS — L282 Other prurigo: Secondary | ICD-10-CM | POA: Diagnosis not present

## 2024-04-05 DIAGNOSIS — L738 Other specified follicular disorders: Secondary | ICD-10-CM | POA: Diagnosis not present

## 2024-04-11 DIAGNOSIS — H35363 Drusen (degenerative) of macula, bilateral: Secondary | ICD-10-CM | POA: Diagnosis not present

## 2024-04-11 DIAGNOSIS — H353212 Exudative age-related macular degeneration, right eye, with inactive choroidal neovascularization: Secondary | ICD-10-CM | POA: Diagnosis not present

## 2024-04-11 NOTE — Progress Notes (Signed)
 CHIEF COMPLAINT Chief Complaint  Patient presents with  . Retinal Injection      HISTORY OF PRESENT ILLNESS: Yolanda Nicholson is a 78 y.o. y.o. old female who presents to the clinic today for 18 week follow up for exudative age-related macular degeneration of right eye with active choroidal neovascularization. Patient has hx of advanced atrophic nonexudative age-related macular degeneration of left eye without subfoveal involvement and pseudophakia of both eyes. Patient has hx of s/p IVE and IVF OD. She is s/p IVF OD #6 08/10/23, s/p IVF SAMPLE OD #7 12/07/23, s/p IVF OD #8 04/11/24 .   Today the patient reports stable vision.   She denies ocular pain, flashes, floaters and other concerns at this time.  Denies any fevers, weight loss, night sweats.   Review of Systems:   Constitutional symptoms: negative Eyes:  negative Ear, nose, throat:  negative Cardiovascular:  negative Respiratory:  negative Gastrointestinal:  negative Genitourinary:  negative Skin:  negative Neurological:  negative Musculoskeletal:  negative Psychiatric:  negative Endocrine:  negative Hematological:  negative Allergic:  negative   Selected notes from the MEDICAL RECORD NUMBER     CURRENT DROPS: See med list  Referring physician: Paticia Allene Fairly, MD [address] [place],  [place] [id number]  REVIEW OF SYSTEMS: See tech note  MEDICATIONS Current Outpatient Medications  Medication Sig Dispense Refill  . hydroxyUREA  (HYDREA ) 500 mg capsule Take 50 mg/kg by mouth daily. Taking 500 mg    . vitamins A,C,E-zinc-copper (PreserVision AREDS) 4,296 mcg-226 mg-90 mg cap Take 1 capsule by mouth 2 (two) times a day.    SABRA alendronate (FOSAMAX) 70 mg tablet Take 70 mg by mouth once a week.    SABRA atorvastatin (LIPITOR) 10 mg tablet Take 20 mg by mouth daily.    . cholecalciferol (VITAMIN D3) 2,000 unit tablet Take 2,000 Units by mouth daily.    SABRA ezetimibe (ZETIA) 10 mg tablet Take 10 mg by mouth  Once Daily.    . metroNIDAZOLE (FLAGYL) 500 mg tablet Take 1 tablet (500 mg total) by mouth 2 (two) times a day for 20 days. 20 tablet 1  . propylene glycoL (Systane Balance) 0.6 % drop ophthalmic solution Administer 1 drop into affected eye(s) as needed.     No current facility-administered medications for this visit.     ALLERGIES Allergies  Allergen Reactions  . Sulfa (Sulfonamide Antibiotics) Rash  . Povidone-Iodine Swelling    Red and swelling  . Penicillins Rash    PAST MEDICAL HISTORY Past Surgical History:  Procedure Laterality Date  . CATARACT EXTRACTION     Procedure: CATARACT EXTRACTION    FAMILY HISTORY Family History  Problem Relation Name Age of Onset  . Macular degeneration Mother    . Cancer Father    . Hypertension Father    . Stroke Father    . Glaucoma Brother       SOCIAL HISTORY Social History   Socioeconomic History  . Marital status: Married    Spouse name: Not on file  . Number of children: Not on file  . Years of education: Not on file  . Highest education level: Not on file  Occupational History  . Not on file  Tobacco Use  . Smoking status: Never  . Smokeless tobacco: Never  Substance and Sexual Activity  . Alcohol  use: Not on file  . Drug use: Not on file  . Sexual activity: Not on file  Other Topics Concern  . Not on file  Social History Narrative  . Not on file   Social Drivers of Health   Food Insecurity: No Food Insecurity (10/15/2023)   Received [facility]   Food vital sign   . Within the past 12 months, you worried that your food would run out before you got money to buy more: Never true   . Within the past 12 months, the food you bought just didn't last and you didn't have money to get more: Never true  Transportation Needs: No Transportation Needs (10/15/2023)   Received from [facility] - Transportation   . Lack of Transportation (Medical): No   . Lack of Transportation (Non-Medical): No  Safety:  Not At Risk (10/15/2023)   Received from [facility]   Safety   . Within the last year, have you been afraid of your partner or ex-partner?: No   . Within the last year, have you been humiliated or emotionally abused in other ways by your partner or ex-partner?: No   . Within the last year, have you been kicked, hit, slapped, or otherwise physically hurt by your partner or ex-partner?: No   . Within the last year, have you been raped or forced to have any kind of sexual activity by your partner or ex-partner?: No  Living Situation: Unknown (10/15/2023)   Received from [facility] Situation   . In the last 12 months, was there a time when you were not able to pay the mortgage or rent on time?: No   . Number of Times Moved in the Last Year: Not on file   . At any time in the past 12 months, were you homeless or living in a shelter (including now)?: No       OPHTHALMIC EXAM: Base Eye Exam     Visual Acuity (Snellen - Linear)       Right Left   Dist cc 20/25 +1 20/25 +1    Correction: Glasses         Tonometry (Tonopen: Tetracaine OU, 9:03 AM)       Right Left   Pressure 19 21         Pupils       APD   Right None   Left None         Dilation     Both eyes: 2.5% Phenylephrine , 1.0% Tropicamide @ 9:03 AM            LABS No visits with results within 4 Week(s) from this visit.  Latest known visit with results is:  No results found for any previous visit.     IMAGING   Imaging  Testing report: Optical coherence tomography Date obtained - 04/11/2024 Indication for imaging: AMD Technically adequate study. Patient compliance high.    Right Eye:  Central foveal thickness: 222 Findings: drusen, no active CNV, low-lying PED, no SRF/IRF, NFD  Comparison to previous: stable    Left Eye:  Central foveal thickness: 260 Findings: GA, drusen, no active CNV Comparison to previous: stable    Diagnosis / Impression: exudative AMD OD  Clinical  management:  See below   Abbreviations: NFP--Normal foveal profile. Normal OCT. CME--cystoid macular edema. PED--pigment epithelial detachment. SRF--Subretinal fluid.  EZ --ellipsoid zone. ERM Epiretinal membrane.. ORT - outer retinal tubulation. SRHM - subretinal hyper-reflective material     ASSESSMENT/PLAN:   1. Exudative age-related macular degeneration of right eye with active choroidal neovascularization (HCC)  OCT, Macula - OU - Both Eyes  Intravitreal Injection, Pharmacologic Agent - OD - Right Eye   faricimab -svoa (VABYSMO ) intravitreal syringe 6 mg   OCT, Macula - OU - Both Eyes    2. Advanced atrophic nonexudative age-related macular degeneration of left eye without subfoveal involvement      3. Retinal edema      4. Pseudophakia of both eyes         1. Exudative age-related macular degeneration of right eye with active choroidal neovascularization - hx of IVE and IVF OD - I have reviewed the nature and mechanism and discussed the implications to her vision. I have reviewed the various treatment regiments along with their risk and benefits with intravitreal anti VEGF agents. Risks of endophthalmitis and vascular occlusive events and atrophic changes discussed with patient.  I have stressed continued AREDS vitamins and monitoring of her Amsler Grid.  - Exam and imaging on 01/06/22 shows drusen, no active CNV OD - I have discussed she has had great response to IVF OD.  Patient is interested in transitioning care here therefore I will plan for IVF OD at next visit. Patient expressed understanding and is in agreement (01/06/22) - Given signs of exudative macular degeneration, I recommend escalating therapy and will seek approval for Faricimab  (01/06/22).  - mOCT on 05/05/22 shows drusen, no active CNV OD. Extend interval to 16 weeks. - s/p IVF OD #6 08/10/23 - mOCT on 12/07/23 shows drusen no active CNV OD; extend interval to 18 weeks - s/p IVF SAMPLE OD #7 12/07/23 - mOCT on  04/11/2024 shows no active CNV, drusen - I will proceed with IVF OD #8 today 04/11/24  - Follow up in 18 weeks for reevaluation and IVF OD    2. Advanced atrophic nonexudative age-related macular degeneration of left eye without subfoveal involvement - We have had extensive discussion concerning nature and mechanism. I have reassured her that her eyes remains Dry and requires observation at this time.   I have stressed continued AREDS vitamins and monitoring of her Amsler Grid. I have also discussed new treatment option of Syfovre in the setting of non exudative macular degeneration with geographic atrophy. I have discussed nature and mechanism of Syfovre and its risks/benefits.Specifically, I have discussed treatment would typically require monthly injections for two years. Given uncertainty of visual benefits, I recommend holding on Syfovre at this time and continue to observe her left eye. Patient expressed understanding and is in agreement.    3. Retinal edema - No retinal edema noted on imaging or exam - Monitor    4. Pseudophakia of both eyes  - Monitor          Diagnosis: Retinal edema Secondary to Exudative Macular Degeneration  right eye   Intravitreal Fariciumab SAMPLE 6 mg #8 right eye SURGEON: Paticia Fairly, M.D.  ANESTHESIA: Topical lidocaine    The patient was brought to the minor surgery room. Informed consent had already been obtained for intravitreal injection of Faricimab .  The risks of the procedure including potential systemic risks like stroke and heart attack and other thrombotic events as well as the local risks like infection, endophthalmitis, retinal detachment and cataract were discussed. The patient consented to have intravitreal injection.    The right eye was marked and a time out was performed identifying the correct eye. Subsequently, the patient was placed in the supine position. A lid speculum was placed to expose the eye. Topical anesthetic drops were given  prior to placing the speculum. Topical lidocaine  was given. The area was then  cleaned extensively with Betadine swabs and allowed to dry. At this time, 3.5 mm posterior to the limbus, the Fariciumab was injected. The needle was then withdrawn from the eye and the retina was visualized. The central retinal artery was perfused. There were no complications. Discharge instructions were reviewed.        =====  Paticia Fairly, MD  Diseases and Surgery of the Retina and Vitreous and  Uveitis and Ocular Immunology Assistant Professor of Ophthalmology Department of Ophthalmology  [facility]   Requested Prescriptions    No prescriptions requested or ordered in this encounter        Return in about 18 weeks (around 08/15/2024) for Reevaluation, IVF OD .     Abbreviations DME diabetic macular edema; PDR proliferative diabetic retinopathy; NPDR non proliferative diabetic retinopathy;  BRVO  Branch retinal vein occlusion; CRVO central retinal vein occlusion; ; RD retinal detachment; RT retinal tear; SB scleral buckle; PPV pars plana vitrectomy; VH  Vitreous hemorrhage; PRP  panretinal laser photocoagulation; IVK intravitreal kenalog; IVA intravitreal avastin ; IVL intravitreal Lucentis; IVE Intravitreal Eylea ; PVD posterior vitreous detachment; OD right eye; OS left eye; OU  Both eyes;  VMT  vitreomacular traction; MH  Macular hole; ERM epiretinal membrane; NVD neovascularization of the disc; NVE neovascularization elsewhere; AREDS age related eye disease study  This document serves as a record of services personally performed by Paticia Fairly, MD. It was created on their behalf by Charmaine Pinal a trained medical scribe. The creation of this record is the provider's dictation and/or activities during the visit.   I agree the documentation is accurate and complete.  Electronically signed by: Paticia FORBES Fairly MD 04/11/2024 8:10 AM

## 2024-05-04 ENCOUNTER — Inpatient Hospital Stay: Admitting: Oncology

## 2024-05-04 ENCOUNTER — Encounter: Payer: Self-pay | Admitting: Oncology

## 2024-05-04 ENCOUNTER — Inpatient Hospital Stay: Attending: Oncology

## 2024-05-04 VITALS — BP 105/55 | HR 68 | Temp 97.3°F | Resp 15 | Wt 120.9 lb

## 2024-05-04 DIAGNOSIS — Z7983 Long term (current) use of bisphosphonates: Secondary | ICD-10-CM | POA: Insufficient documentation

## 2024-05-04 DIAGNOSIS — I2510 Atherosclerotic heart disease of native coronary artery without angina pectoris: Secondary | ICD-10-CM | POA: Diagnosis not present

## 2024-05-04 DIAGNOSIS — E785 Hyperlipidemia, unspecified: Secondary | ICD-10-CM | POA: Diagnosis not present

## 2024-05-04 DIAGNOSIS — Z79899 Other long term (current) drug therapy: Secondary | ICD-10-CM | POA: Diagnosis not present

## 2024-05-04 DIAGNOSIS — D75839 Thrombocytosis, unspecified: Secondary | ICD-10-CM | POA: Diagnosis not present

## 2024-05-04 DIAGNOSIS — Z7964 Long term (current) use of myelosuppressive agent: Secondary | ICD-10-CM | POA: Insufficient documentation

## 2024-05-04 DIAGNOSIS — Z7982 Long term (current) use of aspirin: Secondary | ICD-10-CM | POA: Insufficient documentation

## 2024-05-04 DIAGNOSIS — D473 Essential (hemorrhagic) thrombocythemia: Secondary | ICD-10-CM | POA: Insufficient documentation

## 2024-05-04 DIAGNOSIS — M810 Age-related osteoporosis without current pathological fracture: Secondary | ICD-10-CM | POA: Diagnosis not present

## 2024-05-04 LAB — CBC WITH DIFFERENTIAL (CANCER CENTER ONLY)
Abs Immature Granulocytes: 0.03 K/uL (ref 0.00–0.07)
Basophils Absolute: 0.1 K/uL (ref 0.0–0.1)
Basophils Relative: 1 %
Eosinophils Absolute: 0.2 K/uL (ref 0.0–0.5)
Eosinophils Relative: 2 %
HCT: 35.1 % — ABNORMAL LOW (ref 36.0–46.0)
Hemoglobin: 11.8 g/dL — ABNORMAL LOW (ref 12.0–15.0)
Immature Granulocytes: 0 %
Lymphocytes Relative: 18 %
Lymphs Abs: 1.5 K/uL (ref 0.7–4.0)
MCH: 33.1 pg (ref 26.0–34.0)
MCHC: 33.6 g/dL (ref 30.0–36.0)
MCV: 98.3 fL (ref 80.0–100.0)
Monocytes Absolute: 0.9 K/uL (ref 0.1–1.0)
Monocytes Relative: 11 %
Neutro Abs: 5.8 K/uL (ref 1.7–7.7)
Neutrophils Relative %: 68 %
Platelet Count: 404 K/uL — ABNORMAL HIGH (ref 150–400)
RBC: 3.57 MIL/uL — ABNORMAL LOW (ref 3.87–5.11)
RDW: 13.1 % (ref 11.5–15.5)
WBC Count: 8.5 K/uL (ref 4.0–10.5)
nRBC: 0 % (ref 0.0–0.2)

## 2024-05-04 LAB — CMP (CANCER CENTER ONLY)
ALT: 29 U/L (ref 0–44)
AST: 30 U/L (ref 15–41)
Albumin: 4.3 g/dL (ref 3.5–5.0)
Alkaline Phosphatase: 51 U/L (ref 38–126)
Anion gap: 6 (ref 5–15)
BUN: 16 mg/dL (ref 8–23)
CO2: 28 mmol/L (ref 22–32)
Calcium: 9.4 mg/dL (ref 8.9–10.3)
Chloride: 105 mmol/L (ref 98–111)
Creatinine: 0.54 mg/dL (ref 0.44–1.00)
GFR, Estimated: >60 mL/min (ref >60)
Glucose, Bld: 97 mg/dL (ref 70–99)
Potassium: 4 mmol/L (ref 3.5–5.1)
Sodium: 139 mmol/L (ref 135–145)
Total Bilirubin: 0.6 mg/dL (ref 0.0–1.2)
Total Protein: 7.2 g/dL (ref 6.5–8.1)

## 2024-05-04 LAB — LACTATE DEHYDROGENASE: LDH: 157 U/L (ref 98–192)

## 2024-05-04 MED ORDER — HYDROXYUREA 500 MG PO CAPS
500.0000 mg | ORAL_CAPSULE | Freq: Every day | ORAL | 1 refills | Status: AC
Start: 2024-05-04 — End: ?

## 2024-05-04 NOTE — Assessment & Plan Note (Addendum)
 Chronic thrombocytosis with platelet counts ranging from 551,000 to 685,000 since August 2024.    On her consultation with us  on 10/15/2023, labs showed persistent thrombocytosis but platelet count was slightly better at 588,000.  White count 8800 with normal differential.  Hemoglobin normal at 12.2, MCV 91.2.  CMP unremarkable.  LDH, Sed rate, CRP were all within normal limits.  Iron studies showed no evidence of iron deficiency.  Ferritin was borderline low at 11.   We pursued JAK2 mutation analysis with reflex testing to include CALR, MPL, exon 12-15 mutations to rule out primary myeloproliferative neoplasm.  JAK2 mutation was negative.  However reflex testing showed that she has MPL mutation positivity. We also checked BCR/ABL 1 and it was negative.  Since patient was traveling out of country from 10/28/2023 until 11/14/2023, bone marrow biopsy was obtained on 11/26/2023 once she returned.    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  Megakaryocytes were significantly increased in number with clustering and abnormal lobulation.  Conventional cytogenetics showed normal female karyotype, XX.  IPSET thrombosis score of 1, low risk.    Discussed the nature of essential thrombocythemia as a myeloproliferative neoplasm. Emphasized the importance of controlling cell division to prevent bone marrow exhaustion and scarring. Discussed potential symptoms to monitor, including unexplained infections, fevers, chills, night sweats, unusual bleeding, decreased appetite, low energy, and shortness of breath.  Platelet count was increased at 647,000 on 12/08/2023.  White count and hemoglobin remain within normal limits.  On 12/08/2023, we started her on cytoreductive therapy with hydroxyurea  with goal to maintain platelet count below 400,000.  Initially we started her on hydroxyurea  500 mg once daily.   She has been compliant with hydroxyurea   500 mg once daily and tolerating it well.  No major side effects.  Platelet count is better at 404,000 today.  Hemoglobin 11.8, white count normal.  Will continue current dose of hydroxyurea  and monitor labs closely. The current treatment regimen is effective, and there is no need to adjust the dosage of Hydrea  as long as other blood counts remain stable. Consider reducing hydroxyurea  dosage if hemoglobin drops below 10 or white blood cell count decreases significantly.  She was advised to continue aspirin 81 mg daily to prevent vasomotor symptoms and for clot prevention.  RTC in 8 weeks for follow-up with repeat labs.  If stable labs, will slowly space out clinic intervals.

## 2024-05-04 NOTE — Progress Notes (Signed)
 [facility]  HEMATOLOGY CLINIC PROGRESS NOTE  PATIENT NAME: Yolanda Nicholson   MR#: [id number] DOB: 1946/04/28  Patient Care Team: Loreli Elsie JONETTA Mickey., MD as PCP - General (Internal Medicine)  Date of visit: 05/04/2024   ASSESSMENT & PLAN:   Yolanda Nicholson is a 78 y.o. lady with a past medical history of noncritical coronary artery disease, osteoporosis, diverticulitis, dyslipidemia, macular degeneration, was referred to our service in March 2025 for evaluation of thrombocytosis. MPL mutation positive. BM biopsy on 11/26/23 consistent with Essential Thrombocytosis.    Essential thrombocytosis (HCC) Chronic thrombocytosis with platelet counts ranging from 551,000 to 685,000 since August 2024.    On her consultation with us  on 10/15/2023, labs showed persistent thrombocytosis but platelet count was slightly better at 588,000.  White count 8800 with normal differential.  Hemoglobin normal at 12.2, MCV 91.2.  CMP unremarkable.  LDH, Sed rate, CRP were all within normal limits.  Iron studies showed no evidence of iron deficiency.  Ferritin was borderline low at 11.   We pursued JAK2 mutation analysis with reflex testing to include CALR, MPL, exon 12-15 mutations to rule out primary myeloproliferative neoplasm.  JAK2 mutation was negative.  However reflex testing showed that she has MPL mutation positivity. We also checked BCR/ABL 1 and it was negative.  Since patient was traveling out of country from 10/28/2023 until 11/14/2023, bone marrow biopsy was obtained on 11/26/2023 once she returned.    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  Megakaryocytes were significantly increased in number with clustering and abnormal lobulation.  Conventional cytogenetics showed normal female karyotype, XX.  IPSET thrombosis score of 1, low risk.    Discussed the nature of essential thrombocythemia as a  myeloproliferative neoplasm. Emphasized the importance of controlling cell division to prevent bone marrow exhaustion and scarring. Discussed potential symptoms to monitor, including unexplained infections, fevers, chills, night sweats, unusual bleeding, decreased appetite, low energy, and shortness of breath.  Platelet count was increased at 647,000 on 12/08/2023.  White count and hemoglobin remain within normal limits.  On 12/08/2023, we started her on cytoreductive therapy with hydroxyurea  with goal to maintain platelet count below 400,000.  Initially we started her on hydroxyurea  500 mg once daily.   She has been compliant with hydroxyurea  500 mg once daily and tolerating it well.  No major side effects.  Platelet count is better at 404,000 today.  Hemoglobin 11.8, white count normal.  Will continue current dose of hydroxyurea  and monitor labs closely. The current treatment regimen is effective, and there is no need to adjust the dosage of Hydrea  as long as other blood counts remain stable. Consider reducing hydroxyurea  dosage if hemoglobin drops below 10 or white blood cell count decreases significantly.  She was advised to continue aspirin 81 mg daily to prevent vasomotor symptoms and for clot prevention.  RTC in 8 weeks for follow-up with repeat labs.  If stable labs, will slowly space out clinic intervals.   I spent a total of 25 minutes during this encounter with the patient including review of chart and various tests results, discussions about plan of care and coordination of care plan.  I reviewed lab results and outside records for this visit and discussed relevant results with the patient. Diagnosis, plan of care and treatment options were also discussed in detail with the patient. Opportunity provided to ask questions and answers provided to her apparent satisfaction. Provided instructions to call our  clinic with any problems, questions or concerns prior to return visit. I recommended to  continue follow-up with PCP and sub-specialists. She verbalized understanding and agreed with the plan. No barriers to learning was detected.  Chinita Patten, MD  05/04/2024 9:23 AM   [facility] - A DEPT OF [facility] [address] [place] [address] Dept: [phone] Dept Fax: [phone]   CHIEF COMPLAINT/ REASON FOR VISIT:  Follow up for Essential thrombocytosis, MPL mutation positive.  Confirmed on bone marrow biopsy on 11/26/2023.  INTERVAL HISTORY:  Discussed the use of AI scribe software for clinical note transcription with the patient, who gave verbal consent to proceed.  History of Present Illness  Yolanda Nicholson is a 78 year old female with essential thrombocythemia who presents for routine follow-up.  She is currently on Hydrea  (hydroxyurea ) and a baby aspirin daily. Her platelet count is 404,000; at her last visit, it was 420,000. Her white blood cell count is 8,500, and her hemoglobin is 11.8.  No fevers, chills, night sweats, unusual bruising, or bleeding problems, except for a bruise from a recent COVID shot. She has been maintaining her weight and eating well. She is planning a trip to [place] from October 28th to November 18th and has received a COVID shot in preparation for this travel.  She mentioned that she might need a special prescription for Hydrea  to cover her trip duration as her current supply will not last until the end of November.    SUMMARY OF HEMATOLOGIC HISTORY:  Routine labs at her PCPs office on 08/30/2023 showed platelet count of 682,000.  White count was 7800 with normal differential, hemoglobin 12.1.  Previously labs in September 2024 showed platelet count of 551,000 and labs in August 2024 showed platelet count of 685,000.  Given persistent thrombocytosis, referral was sent to us  for further evaluation.   In August 2024, the patient experienced a  gastrointestinal illness that she suspected was C. diff, but tests were negative. This illness was followed by a bout of diverticulitis, a condition the patient has experienced three times over the past few years. Since this time, the patient has reported a lack of energy and fatigue, which is unusual for her.   The patient is currently on cholesterol medication and takes aspirin regularly.   She did not have prior splenectomy. She had no prior history or diagnosis of cancer. Her age appropriate screening programs are up-to-date. Patient has never been diagnosed with thrombotic events.   Chronic thrombocytosis with platelet counts ranging from 551,000 to 685,000 since August 2024.    Differential diagnosis includes essential thrombocytosis (ET), reactive thrombocytosis due to stress, infection, iron deficiency, or thyroid  abnormalities.    On her consultation with us  on 10/15/2023, labs showed persistent thrombocytosis but platelet count was slightly better at 588,000.  White count 8800 with normal differential.  Hemoglobin normal at 12.2, MCV 91.2.  CMP unremarkable.  LDH, Sed rate, CRP were all within normal limits.  Iron studies showed no evidence of iron deficiency.  Ferritin was borderline low at 11.   We pursued JAK2 mutation analysis with reflex testing to include CALR, MPL, exon 12-15 mutations to rule out primary myeloproliferative neoplasm.  JAK2 mutation was negative.  However reflex testing showed that she has MPL mutation positivity. We also checked BCR/ABL 1 and it was negative.  Since patient was traveling out of country from 10/28/2023 until 11/14/2023, bone marrow biopsy was  obtained on 11/26/2023 once she returned.    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  Megakaryocytes were significantly increased in number with clustering and abnormal lobulation.  Conventional cytogenetics showed normal female karyotype,  XX.  IPSET thrombosis score of 1, low risk.    On 12/08/2023, we started her on cytoreductive therapy with hydroxyurea  500 mg daily with goal to maintain platelet count below 400,000.  She was advised to continue aspirin 81 mg daily to prevent vasomotor symptoms.  I have reviewed the past medical history, past surgical history, social history and family history with the patient and they are unchanged from previous note.  ALLERGIES: She is allergic to atorvastatin, betadine [povidone iodine], penicillins, and sulfa antibiotics.  MEDICATIONS:  Current Outpatient Medications  Medication Sig Dispense Refill   alendronate (FOSAMAX) 70 MG tablet Take 70 mg by mouth once a week.     aspirin EC 81 MG tablet Take 81 mg by mouth daily.     Cholecalciferol (VITAMIN D) 2000 UNITS tablet Take 2,000 Units by mouth daily.      ezetimibe (ZETIA) 10 MG tablet Take 10 mg by mouth daily.     Multiple Vitamins-Minerals (PRESERVISION AREDS 2 PO) Take by mouth.     Propylene Glycol (SYSTANE BALANCE OP) Apply to eye.     rosuvastatin  (CRESTOR ) 20 MG tablet Take 20 mg by mouth daily.     tobramycin (TOBREX) 0.3 % ophthalmic solution      hydroxyurea  (HYDREA ) 500 MG capsule Take 1 capsule (500 mg total) by mouth daily. May take with food to minimize GI side effects. 90 capsule 1   No current facility-administered medications for this visit.     REVIEW OF SYSTEMS:    Review of Systems - Oncology  All other pertinent systems were reviewed with the patient and are negative.  PHYSICAL EXAMINATION:   Onc Performance Status - 05/04/24 0815       ECOG Perf Status   ECOG Perf Status Fully active, able to carry on all pre-disease performance without restriction      KPS SCALE   KPS % SCORE Able to carry on normal activity, minor s/s of disease          Vitals:   05/04/24 0802  BP: (!) 105/55  Pulse: 68  Resp: 15  Temp: (!) 97.3 F (36.3 C)  SpO2: 100%   Filed Weights   05/04/24 0802  Weight:  120 lb 14.4 oz (54.8 kg)    Physical Exam Constitutional:      General: She is not in acute distress.    Appearance: Normal appearance.  HENT:     Head: Normocephalic and atraumatic.  Eyes:     General: No scleral icterus.    Conjunctiva/sclera: Conjunctivae normal.  Cardiovascular:     Rate and Rhythm: Normal rate and regular rhythm.  Pulmonary:     Effort: Pulmonary effort is normal.  Abdominal:     General: There is no distension.  Musculoskeletal:     Right lower leg: No edema.     Left lower leg: No edema.  Neurological:     General: No focal deficit present.     Mental Status: She is alert and oriented to person, place, and time.  Psychiatric:        Mood and Affect: Mood normal.        Behavior: Behavior normal.        Thought Content: Thought content normal.  LABORATORY DATA:   I have reviewed the data as listed.  Results for orders placed or performed in visit on 05/04/24  Lactate dehydrogenase  Result Value Ref Range   LDH 157 98 - 192 U/L  CMP (Cancer Center only)  Result Value Ref Range   Sodium 139 135 - 145 mmol/L   Potassium 4.0 3.5 - 5.1 mmol/L   Chloride 105 98 - 111 mmol/L   CO2 28 22 - 32 mmol/L   Glucose, Bld 97 70 - 99 mg/dL   BUN 16 8 - 23 mg/dL   Creatinine 9.45 9.55 - 1.00 mg/dL   Calcium  9.4 8.9 - 10.3 mg/dL   Total Protein 7.2 6.5 - 8.1 g/dL   Albumin 4.3 3.5 - 5.0 g/dL   AST 30 15 - 41 U/L   ALT 29 0 - 44 U/L   Alkaline Phosphatase 51 38 - 126 U/L   Total Bilirubin 0.6 0.0 - 1.2 mg/dL   GFR, Estimated >39 >39 mL/min   Anion gap 6 5 - 15  CBC with Differential (Cancer Center Only)  Result Value Ref Range   WBC Count 8.5 4.0 - 10.5 K/uL   RBC 3.57 (L) 3.87 - 5.11 MIL/uL   Hemoglobin 11.8 (L) 12.0 - 15.0 g/dL   HCT 64.8 (L) 63.9 - 53.9 %   MCV 98.3 80.0 - 100.0 fL   MCH 33.1 26.0 - 34.0 pg   MCHC 33.6 30.0 - 36.0 g/dL   RDW 86.8 88.4 - 84.4 %   Platelet Count 404 (H) 150 - 400 K/uL   nRBC 0.0 0.0 - 0.2 %   Neutrophils  Relative % 68 %   Neutro Abs 5.8 1.7 - 7.7 K/uL   Lymphocytes Relative 18 %   Lymphs Abs 1.5 0.7 - 4.0 K/uL   Monocytes Relative 11 %   Monocytes Absolute 0.9 0.1 - 1.0 K/uL   Eosinophils Relative 2 %   Eosinophils Absolute 0.2 0.0 - 0.5 K/uL   Basophils Relative 1 %   Basophils Absolute 0.1 0.0 - 0.1 K/uL   Immature Granulocytes 0 %   Abs Immature Granulocytes 0.03 0.00 - 0.07 K/uL     RADIOGRAPHIC STUDIES:  No recent pertinent imaging available to review.   Orders Placed This Encounter  Procedures   CBC with Differential ([facility]cer Center Only)    Standing Status:   Future    Expiration Date:   05/04/2025   CMP (Cancer Center only)    Standing Status:   Future    Expiration Date:   05/04/2025   Lactate dehydrogenase    Standing Status:   Future    Expiration Date:   05/04/2025     Future Appointments  Date Time Provider Department Center  06/29/2024  8:00 AM [facility] None  06/29/2024  8:30 AM Jasim Harari, Chinita, MD [facility] None     This document was completed utilizing speech recognition software. Grammatical errors, random word insertions, pronoun errors, and incomplete sentences are an occasional consequence of this system due to software limitations, ambient noise, and hardware issues. Any formal questions or concerns about the content, text or information contained within the body of this dictation should be directly addressed to the provider for clarification.

## 2024-06-26 DIAGNOSIS — N390 Urinary tract infection, site not specified: Secondary | ICD-10-CM | POA: Diagnosis not present

## 2024-06-29 ENCOUNTER — Inpatient Hospital Stay: Admitting: Oncology

## 2024-06-29 ENCOUNTER — Inpatient Hospital Stay: Attending: Oncology

## 2024-06-29 VITALS — BP 134/78 | HR 62 | Temp 98.1°F | Resp 17 | Ht 63.0 in | Wt 117.9 lb

## 2024-06-29 DIAGNOSIS — D473 Essential (hemorrhagic) thrombocythemia: Secondary | ICD-10-CM | POA: Insufficient documentation

## 2024-06-29 DIAGNOSIS — Z7964 Long term (current) use of myelosuppressive agent: Secondary | ICD-10-CM | POA: Insufficient documentation

## 2024-06-29 DIAGNOSIS — Z7983 Long term (current) use of bisphosphonates: Secondary | ICD-10-CM | POA: Diagnosis not present

## 2024-06-29 DIAGNOSIS — Z79899 Other long term (current) drug therapy: Secondary | ICD-10-CM | POA: Insufficient documentation

## 2024-06-29 DIAGNOSIS — Z7982 Long term (current) use of aspirin: Secondary | ICD-10-CM | POA: Diagnosis not present

## 2024-06-29 DIAGNOSIS — M810 Age-related osteoporosis without current pathological fracture: Secondary | ICD-10-CM | POA: Insufficient documentation

## 2024-06-29 DIAGNOSIS — N390 Urinary tract infection, site not specified: Secondary | ICD-10-CM | POA: Insufficient documentation

## 2024-06-29 LAB — CMP (CANCER CENTER ONLY)
ALT: 39 U/L (ref 0–44)
AST: 50 U/L — ABNORMAL HIGH (ref 15–41)
Albumin: 4.4 g/dL (ref 3.5–5.0)
Alkaline Phosphatase: 70 U/L (ref 38–126)
Anion gap: 9 (ref 5–15)
BUN: 12 mg/dL (ref 8–23)
CO2: 27 mmol/L (ref 22–32)
Calcium: 9 mg/dL (ref 8.9–10.3)
Chloride: 104 mmol/L (ref 98–111)
Creatinine: 0.55 mg/dL (ref 0.44–1.00)
GFR, Estimated: >60 mL/min (ref >60)
Glucose, Bld: 104 mg/dL — ABNORMAL HIGH (ref 70–99)
Potassium: 4 mmol/L (ref 3.5–5.1)
Sodium: 140 mmol/L (ref 135–145)
Total Bilirubin: 0.5 mg/dL (ref 0.0–1.2)
Total Protein: 7.3 g/dL (ref 6.5–8.1)

## 2024-06-29 LAB — CBC WITH DIFFERENTIAL (CANCER CENTER ONLY)
Abs Immature Granulocytes: 0.01 K/uL (ref 0.00–0.07)
Basophils Absolute: 0.1 K/uL (ref 0.0–0.1)
Basophils Relative: 1 %
Eosinophils Absolute: 0.1 K/uL (ref 0.0–0.5)
Eosinophils Relative: 2 %
HCT: 37.3 % (ref 36.0–46.0)
Hemoglobin: 12.1 g/dL (ref 12.0–15.0)
Immature Granulocytes: 0 %
Lymphocytes Relative: 19 %
Lymphs Abs: 1.2 K/uL (ref 0.7–4.0)
MCH: 32.5 pg (ref 26.0–34.0)
MCHC: 32.4 g/dL (ref 30.0–36.0)
MCV: 100.3 fL — ABNORMAL HIGH (ref 80.0–100.0)
Monocytes Absolute: 0.8 K/uL (ref 0.1–1.0)
Monocytes Relative: 12 %
Neutro Abs: 4.3 K/uL (ref 1.7–7.7)
Neutrophils Relative %: 66 %
Platelet Count: 427 K/uL — ABNORMAL HIGH (ref 150–400)
RBC: 3.72 MIL/uL — ABNORMAL LOW (ref 3.87–5.11)
RDW: 13.2 % (ref 11.5–15.5)
WBC Count: 6.5 K/uL (ref 4.0–10.5)
nRBC: 0 % (ref 0.0–0.2)

## 2024-06-29 LAB — LACTATE DEHYDROGENASE: LDH: 191 U/L (ref 105–235)

## 2024-06-29 NOTE — Assessment & Plan Note (Signed)
 Chronic thrombocytosis with platelet counts ranging from 551,000 to 685,000 since August 2024.    On her consultation with us  on 10/15/2023, labs showed persistent thrombocytosis but platelet count was slightly better at 588,000.  White count 8800 with normal differential.  Hemoglobin normal at 12.2, MCV 91.2.  CMP unremarkable.  LDH, Sed rate, CRP were all within normal limits.  Iron studies showed no evidence of iron deficiency.  Ferritin was borderline low at 11.   We pursued JAK2 mutation analysis with reflex testing to include CALR, MPL, exon 12-15 mutations to rule out primary myeloproliferative neoplasm.  JAK2 mutation was negative.  However reflex testing showed that she has MPL mutation positivity. We also checked BCR/ABL 1 and it was negative.  Since patient was traveling out of country from 10/28/2023 until 11/14/2023, bone marrow biopsy was obtained on 11/26/2023 once she returned.    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  Megakaryocytes were significantly increased in number with clustering and abnormal lobulation.  Conventional cytogenetics showed normal female karyotype, XX.  IPSET thrombosis score of 1, low risk.    Discussed the nature of essential thrombocythemia as a myeloproliferative neoplasm. Emphasized the importance of controlling cell division to prevent bone marrow exhaustion and scarring. Discussed potential symptoms to monitor, including unexplained infections, fevers, chills, night sweats, unusual bleeding, decreased appetite, low energy, and shortness of breath.  Platelet count was increased at 647,000 on 12/08/2023.  White count and hemoglobin remain within normal limits.  On 12/08/2023, we started her on cytoreductive therapy with hydroxyurea  with goal to maintain platelet count below 400,000.  Initially we started her on hydroxyurea  500 mg once daily.   She has been compliant with hydroxyurea   500 mg once daily and tolerating it well.  No major side effects.    Platelet count is slightly elevated at 427,000, but within acceptable range. No symptoms of thrombosis such as leg swelling, pain, redness, chest pain, or dyspnea. Current management with hydroxyurea  and aspirin is effective. No need to adjust hydroxyurea  dosing as white blood cell count and hemoglobin are normal.  Consider reducing hydroxyurea  dosage if hemoglobin drops below 10 or white blood cell count decreases significantly.  She was advised to continue aspirin 81 mg daily to prevent vasomotor symptoms and for clot prevention.  RTC in 8 weeks for follow-up with repeat labs.  If stable labs, will slowly space out clinic intervals.

## 2024-06-29 NOTE — Progress Notes (Signed)
 [facility]  HEMATOLOGY CLINIC PROGRESS NOTE  PATIENT NAME: Yolanda Nicholson   MR#: [id number] DOB: 05/25/1946  Patient Care Team: Loreli Elsie JONETTA Mickey., MD as PCP - General (Internal Medicine)  Date of visit: 06/29/2024   ASSESSMENT & PLAN:   Yolanda Nicholson is a 78 y.o. lady with a past medical history of noncritical coronary artery disease, osteoporosis, diverticulitis, dyslipidemia, macular degeneration, was referred to our service in March 2025 for evaluation of thrombocytosis. MPL mutation positive. BM biopsy on 11/26/23 consistent with Essential Thrombocytosis.    Essential thrombocytosis (HCC) Chronic thrombocytosis with platelet counts ranging from 551,000 to 685,000 since August 2024.    On her consultation with us  on 10/15/2023, labs showed persistent thrombocytosis but platelet count was slightly better at 588,000.  White count 8800 with normal differential.  Hemoglobin normal at 12.2, MCV 91.2.  CMP unremarkable.  LDH, Sed rate, CRP were all within normal limits.  Iron studies showed no evidence of iron deficiency.  Ferritin was borderline low at 11.   We pursued JAK2 mutation analysis with reflex testing to include CALR, MPL, exon 12-15 mutations to rule out primary myeloproliferative neoplasm.  JAK2 mutation was negative.  However reflex testing showed that she has MPL mutation positivity. We also checked BCR/ABL 1 and it was negative.  Since patient was traveling out of country from 10/28/2023 until 11/14/2023, bone marrow biopsy was obtained on 11/26/2023 once she returned.    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  Megakaryocytes were significantly increased in number with clustering and abnormal lobulation.  Conventional cytogenetics showed normal female karyotype, XX.  IPSET thrombosis score of 1, low risk.    Discussed the nature of essential thrombocythemia as a  myeloproliferative neoplasm. Emphasized the importance of controlling cell division to prevent bone marrow exhaustion and scarring. Discussed potential symptoms to monitor, including unexplained infections, fevers, chills, night sweats, unusual bleeding, decreased appetite, low energy, and shortness of breath.  Platelet count was increased at 647,000 on 12/08/2023.  White count and hemoglobin remain within normal limits.  On 12/08/2023, we started her on cytoreductive therapy with hydroxyurea  with goal to maintain platelet count below 400,000.  Initially we started her on hydroxyurea  500 mg once daily.   She has been compliant with hydroxyurea  500 mg once daily and tolerating it well.  No major side effects.    Platelet count is slightly elevated at 427,000, but within acceptable range. No symptoms of thrombosis such as leg swelling, pain, redness, chest pain, or dyspnea. Current management with hydroxyurea  and aspirin is effective. No need to adjust hydroxyurea  dosing as white blood cell count and hemoglobin are normal.  Consider reducing hydroxyurea  dosage if hemoglobin drops below 10 or white blood cell count decreases significantly.  She was advised to continue aspirin 81 mg daily to prevent vasomotor symptoms and for clot prevention.  RTC in 8 weeks for follow-up with repeat labs.  If stable labs, will slowly space out clinic intervals.  Urinary tract infection Recurrent urinary tract infection, currently being treated with antibiotics. No fever, chills, night sweats, cough, or dyspnea. White blood cell count is normal, indicating no systemic infection. Platelet count is slightly elevated, possibly due to infection. - Continue current antibiotic regimen for one week - Follow up with Dr. Loreli in two weeks for re-evaluation  I spent a total of 32 minutes during this encounter with the patient including review of chart and various tests results,  discussions about plan of care and coordination of  care plan.  I reviewed lab results and outside records for this visit and discussed relevant results with the patient. Diagnosis, plan of care and treatment options were also discussed in detail with the patient. Opportunity provided to ask questions and answers provided to her apparent satisfaction. Provided instructions to call our clinic with any problems, questions or concerns prior to return visit. I recommended to continue follow-up with PCP and sub-specialists. She verbalized understanding and agreed with the plan. No barriers to learning was detected.  Yolanda Patten, MD  06/29/2024 8:46 AM  [facility] - [facility] DE[facility] [address] [place] [address] Dept: [phone] Dept Fax: [phone]   CHIEF COMPLAINT/ REASON FOR VISIT:  Follow up for Essential thrombocytosis, MPL mutation positive.  Confirmed on bone marrow biopsy on 11/26/2023.  INTERVAL HISTORY:  Discussed the use of AI scribe software for clinical note transcription with the patient, who gave verbal consent to proceed.  History of Present Illness Yolanda Nicholson is a 78 year old female who presents with a urinary tract infection.  She has been experiencing symptoms of a urinary tract infection for some time before seeking medical attention on Monday. She is currently on a week-long course of antibiotics. She describes this UTI as unexpected and cannot recall the last occurrence, although she has had approximately four UTIs in the past.  She is currently taking hydroxyurea  500 mg once daily and baby aspirin. Recent blood work shows a normal white blood cell count of 6,500, normal hemoglobin at 12.1, and a slightly elevated platelet count of 427, up from 404 previously.  No fevers, chills, night sweats, cough, or trouble breathing. She also reports no abdominal pain and attributes recent weight loss to increased physical activity  during a recent trip to [place].  She notes occasional pain behind her knees at night, which resolves with stretching. No swelling in the legs or redness.  She recently returned from a three-week trip to [place], where she visited several cities including [place], [place], [place], [place], and [place]. She reports being more physically active during the trip, which may have contributed to her weight loss.    SUMMARY OF HEMATOLOGIC HISTORY:  Routine labs at her PCPs office on 08/30/2023 showed platelet count of 682,000.  White count was 7800 with normal differential, hemoglobin 12.1.  Previously labs in September 2024 showed platelet count of 551,000 and labs in August 2024 showed platelet count of 685,000.  Given persistent thrombocytosis, referral was sent to us  for further evaluation.   In August 2024, the patient experienced a gastrointestinal illness that she suspected was C. diff, but tests were negative. This illness was followed by a bout of diverticulitis, a condition the patient has experienced three times over the past few years. Since this time, the patient has reported a lack of energy and fatigue, which is unusual for her.   The patient is currently on cholesterol medication and takes aspirin regularly.   She did not have prior splenectomy. She had no prior history or diagnosis of cancer. Her age appropriate screening programs are up-to-date. Patient has never been diagnosed with thrombotic events.   Chronic thrombocytosis with platelet counts ranging from 551,000 to 685,000 since August 2024.    Differential diagnosis includes essential thrombocytosis (ET), reactive thrombocytosis due to stress, infection, iron deficiency, or thyroid  abnormalities.    On her consultation  with us  on 10/15/2023, labs showed persistent thrombocytosis but platelet count was slightly better at 588,000.  White count 8800 with normal differential.  Hemoglobin normal at 12.2, MCV 91.2.  CMP unremarkable.  LDH, Sed rate,  CRP were all within normal limits.  Iron studies showed no evidence of iron deficiency.  Ferritin was borderline low at 11.   We pursued JAK2 mutation analysis with reflex testing to include CALR, MPL, exon 12-15 mutations to rule out primary myeloproliferative neoplasm.  JAK2 mutation was negative.  However reflex testing showed that she has MPL mutation positivity. We also checked BCR/ABL 1 and it was negative.  Since patient was traveling out of country from 10/28/2023 until 11/14/2023, bone marrow biopsy was obtained on 11/26/2023 once she returned.    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  Megakaryocytes were significantly increased in number with clustering and abnormal lobulation.  Conventional cytogenetics showed normal female karyotype, XX.  IPSET thrombosis score of 1, low risk.    On 12/08/2023, we started her on cytoreductive therapy with hydroxyurea  500 mg daily with goal to maintain platelet count below 400,000.  She was advised to continue aspirin 81 mg daily to prevent vasomotor symptoms.  I have reviewed the past medical history, past surgical history, social history and family history with the patient and they are unchanged from previous note.  ALLERGIES: She is allergic to atorvastatin, betadine [povidone iodine], penicillins, and sulfa antibiotics.  MEDICATIONS:  Current Outpatient Medications  Medication Sig Dispense Refill   alendronate (FOSAMAX) 70 MG tablet Take 70 mg by mouth once a week.     aspirin EC 81 MG tablet Take 81 mg by mouth daily.     Cholecalciferol (VITAMIN D) 2000 UNITS tablet Take 2,000 Units by mouth daily.      ezetimibe (ZETIA) 10 MG tablet Take 10 mg by mouth daily.     hydroxyurea  (HYDREA ) 500 MG capsule Take 1 capsule (500 mg total) by mouth daily. May take with food to minimize GI side effects. 90 capsule 1   MACROBID  100 MG capsule Take 100 mg by mouth 2 (two) times  daily.     Multiple Vitamins-Minerals (PRESERVISION AREDS 2 PO) Take by mouth.     Propylene Glycol (SYSTANE BALANCE OP) Apply to eye.     rosuvastatin  (CRESTOR ) 20 MG tablet Take 20 mg by mouth daily.     tobramycin (TOBREX) 0.3 % ophthalmic solution      No current facility-administered medications for this visit.     REVIEW OF SYSTEMS:    Review of Systems - Oncology  All other pertinent systems were reviewed with the patient and are negative.  PHYSICAL EXAMINATION:   Onc Performance Status - 06/29/24 0828       ECOG Perf Status   ECOG Perf Status Fully active, able to carry on all pre-disease performance without restriction      KPS SCALE   KPS % SCORE Able to carry on normal activity, minor s/s of disease          Vitals:   06/29/24 0827  BP: 134/78  Pulse: 62  Resp: 17  Temp: 98.1 F (36.7 C)  SpO2: 99%   Filed Weights   06/29/24 0827  Weight: 117 lb 14.4 oz (53.5 kg)    Physical Exam Constitutional:      General: She is not in acute distress.    Appearance: Normal appearance.  HENT:     Head: Normocephalic  and atraumatic.  Eyes:     General: No scleral icterus.    Conjunctiva/sclera: Conjunctivae normal.  Cardiovascular:     Rate and Rhythm: Normal rate and regular rhythm.  Pulmonary:     Effort: Pulmonary effort is normal.  Abdominal:     General: There is no distension.  Musculoskeletal:     Right lower leg: No edema.     Left lower leg: No edema.  Neurological:     General: No focal deficit present.     Mental Status: She is alert and oriented to person, place, and time.  Psychiatric:        Mood and Affect: Mood normal.        Behavior: Behavior normal.        Thought Content: Thought content normal.      LABORATORY DATA:   I have reviewed the data as listed.  Results for orders placed or performed in visit on 06/29/24  CBC with Differential (Cancer Center Only)  Result Value Ref Range   WBC Count 6.5 4.0 - 10.5 K/uL   RBC  3.72 (L) 3.87 - 5.11 MIL/uL   Hemoglobin 12.1 12.0 - 15.0 g/dL   HCT 62.6 63.9 - 53.9 %   MCV 100.3 (H) 80.0 - 100.0 fL   MCH 32.5 26.0 - 34.0 pg   MCHC 32.4 30.0 - 36.0 g/dL   RDW 86.7 88.4 - 84.4 %   Platelet Count 427 (H) 150 - 400 K/uL   nRBC 0.0 0.0 - 0.2 %   Neutrophils Relative % 66 %   Neutro Abs 4.3 1.7 - 7.7 K/uL   Lymphocytes Relative 19 %   Lymphs Abs 1.2 0.7 - 4.0 K/uL   Monocytes Relative 12 %   Monocytes Absolute 0.8 0.1 - 1.0 K/uL   Eosinophils Relative 2 %   Eosinophils Absolute 0.1 0.0 - 0.5 K/uL   Basophils Relative 1 %   Basophils Absolute 0.1 0.0 - 0.1 K/uL   Immature Granulocytes 0 %   Abs Immature Granulocytes 0.01 0.00 - 0.07 K/uL  CMP (Cancer Center only)  Result Value Ref Range   Sodium 140 135 - 145 mmol/L   Potassium 4.0 3.5 - 5.1 mmol/L   Chloride 104 98 - 111 mmol/L   CO2 27 22 - 32 mmol/L   Glucose, Bld 104 (H) 70 - 99 mg/dL   BUN 12 8 - 23 mg/dL   Creatinine 9.44 9.55 - 1.00 mg/dL   Calcium  9.0 8.9 - 10.3 mg/dL   Total Protein 7.3 6.5 - 8.1 g/dL   Albumin 4.4 3.5 - 5.0 g/dL   AST 50 (H) 15 - 41 U/L   ALT 39 0 - 44 U/L   Alkaline Phosphatase 70 38 - 126 U/L   Total Bilirubin 0.5 0.0 - 1.2 mg/dL   GFR, Estimated >39 >39 mL/min   Anion gap 9 5 - 15  Lactate dehydrogenase  Result Value Ref Range   LDH 191 105 - 235 U/L     RADIOGRAPHIC STUDIES:  No recent pertinent imaging available to review.  Orders Placed This Encounter  Procedures   CBC with Differential (Cancer Center Only)    Standing Status:   Standing    Number of Occurrences:   6    Expiration Date:   06/29/2025   CMP (Cancer Center only)    Standing Status:   Standing    Number of Occurrences:   6    Expiration Date:   06/29/2025  Lactate dehydrogenase    Standing Status:   Standing    Number of Occurrences:   6    Expiration Date:   06/29/2025     Future Appointments  Date Time Provider Department Center  08/23/2024  8:30 AM [facility] None   08/23/2024  9:00 AM Kevonna Nolte, [place], MD [facility] None    This document was completed utilizing speech recognition software. Grammatical errors, random word insertions, pronoun errors, and incomplete sentences are an occasional consequence of this system due to software limitations, ambient noise, and hardware issues. Any formal questions or concerns about the content, text or information contained within the body of this dictation should be directly addressed to the provider for clarification.

## 2024-06-30 ENCOUNTER — Encounter: Payer: Self-pay | Admitting: Oncology

## 2024-07-12 DIAGNOSIS — N390 Urinary tract infection, site not specified: Secondary | ICD-10-CM | POA: Diagnosis not present

## 2024-08-23 ENCOUNTER — Inpatient Hospital Stay: Admitting: Oncology

## 2024-08-23 ENCOUNTER — Inpatient Hospital Stay: Attending: Oncology

## 2024-08-23 ENCOUNTER — Encounter: Payer: Self-pay | Admitting: Oncology

## 2024-08-23 VITALS — BP 149/81 | HR 79 | Temp 97.7°F | Resp 17 | Wt 116.7 lb

## 2024-08-23 DIAGNOSIS — Z7983 Long term (current) use of bisphosphonates: Secondary | ICD-10-CM | POA: Diagnosis not present

## 2024-08-23 DIAGNOSIS — Z7964 Long term (current) use of myelosuppressive agent: Secondary | ICD-10-CM | POA: Insufficient documentation

## 2024-08-23 DIAGNOSIS — M810 Age-related osteoporosis without current pathological fracture: Secondary | ICD-10-CM | POA: Diagnosis not present

## 2024-08-23 DIAGNOSIS — Z7982 Long term (current) use of aspirin: Secondary | ICD-10-CM | POA: Insufficient documentation

## 2024-08-23 DIAGNOSIS — I2510 Atherosclerotic heart disease of native coronary artery without angina pectoris: Secondary | ICD-10-CM | POA: Insufficient documentation

## 2024-08-23 DIAGNOSIS — D473 Essential (hemorrhagic) thrombocythemia: Secondary | ICD-10-CM | POA: Insufficient documentation

## 2024-08-23 DIAGNOSIS — Z87440 Personal history of urinary (tract) infections: Secondary | ICD-10-CM | POA: Insufficient documentation

## 2024-08-23 DIAGNOSIS — Z79899 Other long term (current) drug therapy: Secondary | ICD-10-CM | POA: Insufficient documentation

## 2024-08-23 DIAGNOSIS — E785 Hyperlipidemia, unspecified: Secondary | ICD-10-CM | POA: Diagnosis not present

## 2024-08-23 LAB — CBC WITH DIFFERENTIAL (CANCER CENTER ONLY)
Abs Immature Granulocytes: 0.04 10*3/uL (ref 0.00–0.07)
Basophils Absolute: 0.1 10*3/uL (ref 0.0–0.1)
Basophils Relative: 1 %
Eosinophils Absolute: 0.1 10*3/uL (ref 0.0–0.5)
Eosinophils Relative: 1 %
HCT: 37.3 % (ref 36.0–46.0)
Hemoglobin: 12.2 g/dL (ref 12.0–15.0)
Immature Granulocytes: 0 %
Lymphocytes Relative: 21 %
Lymphs Abs: 1.9 10*3/uL (ref 0.7–4.0)
MCH: 32.6 pg (ref 26.0–34.0)
MCHC: 32.7 g/dL (ref 30.0–36.0)
MCV: 99.7 fL (ref 80.0–100.0)
Monocytes Absolute: 1.1 10*3/uL — ABNORMAL HIGH (ref 0.1–1.0)
Monocytes Relative: 12 %
Neutro Abs: 6.1 10*3/uL (ref 1.7–7.7)
Neutrophils Relative %: 65 %
Platelet Count: 412 10*3/uL — ABNORMAL HIGH (ref 150–400)
RBC: 3.74 MIL/uL — ABNORMAL LOW (ref 3.87–5.11)
RDW: 12.8 % (ref 11.5–15.5)
WBC Count: 9.3 10*3/uL (ref 4.0–10.5)
nRBC: 0 % (ref 0.0–0.2)

## 2024-08-23 LAB — CMP (CANCER CENTER ONLY)
ALT: 49 U/L — ABNORMAL HIGH (ref 0–44)
AST: 50 U/L — ABNORMAL HIGH (ref 15–41)
Albumin: 4.5 g/dL (ref 3.5–5.0)
Alkaline Phosphatase: 66 U/L (ref 38–126)
Anion gap: 13 (ref 5–15)
BUN: 17 mg/dL (ref 8–23)
CO2: 23 mmol/L (ref 22–32)
Calcium: 9.3 mg/dL (ref 8.9–10.3)
Chloride: 103 mmol/L (ref 98–111)
Creatinine: 0.55 mg/dL (ref 0.44–1.00)
GFR, Estimated: >60 mL/min
Glucose, Bld: 124 mg/dL — ABNORMAL HIGH (ref 70–99)
Potassium: 3.8 mmol/L (ref 3.5–5.1)
Sodium: 140 mmol/L (ref 135–145)
Total Bilirubin: 0.5 mg/dL (ref 0.0–1.2)
Total Protein: 7.4 g/dL (ref 6.5–8.1)

## 2024-08-23 LAB — LACTATE DEHYDROGENASE: LDH: 181 U/L (ref 105–235)

## 2024-08-23 NOTE — Progress Notes (Signed)
 "  [facility]  HEMATOLOGY CLINIC PROGRESS NOTE  PATIENT NAME: Yolanda Nicholson   MR#: [id number] DOB: 1945/08/07  Patient Care Team: Loreli Elsie JONETTA Mickey., MD as PCP - General (Internal Medicine)  Date of visit: 08/23/2024   ASSESSMENT & PLAN:   Yolanda Nicholson is a 79 y.o. lady with a past medical history of noncritical coronary artery disease, osteoporosis, diverticulitis, dyslipidemia, macular degeneration, was referred to our service in March 2025 for evaluation of thrombocytosis. MPL mutation positive. BM biopsy on 11/26/23 consistent with Essential Thrombocytosis.    Essential thrombocytosis (HCC) Chronic thrombocytosis with platelet counts ranging from 551,000 to 685,000 since August 2024.    On her consultation with us  on 10/15/2023, labs showed persistent thrombocytosis but platelet count was slightly better at 588,000.  White count 8800 with normal differential.  Hemoglobin normal at 12.2, MCV 91.2.  CMP unremarkable.  LDH, Sed rate, CRP were all within normal limits.  Iron studies showed no evidence of iron deficiency.  Ferritin was borderline low at 11.   We pursued JAK2 mutation analysis with reflex testing to include CALR, MPL, exon 12-15 mutations to rule out primary myeloproliferative neoplasm.  JAK2 mutation was negative.  However reflex testing showed that she has MPL mutation positivity. We also checked BCR/ABL 1 and it was negative.  Since patient was traveling out of country from 10/28/2023 until 11/14/2023, bone marrow biopsy was obtained on 11/26/2023 once she returned.    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  Megakaryocytes were significantly increased in number with clustering and abnormal lobulation.  Conventional cytogenetics showed normal female karyotype, XX.  IPSET thrombosis score of 1, low risk.    Discussed the nature of essential thrombocythemia as a  myeloproliferative neoplasm. Emphasized the importance of controlling cell division to prevent bone marrow exhaustion and scarring. Discussed potential symptoms to monitor, including unexplained infections, fevers, chills, night sweats, unusual bleeding, decreased appetite, low energy, and shortness of breath.  Platelet count was increased at 647,000 on 12/08/2023.  White count and hemoglobin remain within normal limits.  On 12/08/2023, we started her on cytoreductive therapy with hydroxyurea  with goal to maintain platelet count below 400,000.  Initially we started her on hydroxyurea  500 mg once daily.   She has been compliant with hydroxyurea  500 mg once daily and tolerating it well.  No major side effects.    Platelet count is slightly elevated at 412,000, but within acceptable range. No symptoms of thrombosis such as leg swelling, pain, redness, chest pain, or dyspnea. Current management with hydroxyurea  and aspirin is effective. No need to adjust hydroxyurea  dosing as white blood cell count and hemoglobin are normal.  Consider reducing hydroxyurea  dosage if hemoglobin drops below 10 or white blood cell count decreases significantly.  She was advised to continue aspirin 81 mg daily to prevent vasomotor symptoms and for clot prevention.  - Instructed her to obtain blood counts at her primary care visit in March.  RTC in 3 months for follow-up with repeat labs.    I spent a total of 32 minutes during this encounter with the patient including review of chart and various tests results, discussions about plan of care and coordination of care plan.  I reviewed lab results and outside records for this visit and discussed relevant results with the patient. Diagnosis, plan of care and treatment options were also discussed in detail with the patient. Opportunity provided to ask questions and answers provided  to her apparent satisfaction. Provided instructions to call our clinic with any problems,  questions or concerns prior to return visit. I recommended to continue follow-up with PCP and sub-specialists. She verbalized understanding and agreed with the plan. No barriers to learning was detected.  Yolanda Patten, MD  08/23/2024 9:36 AM  [facility] - A DEPT OF [facility] [address] [place] [address] Dept: [phone] Dept Fax: [phone]   CHIEF COMPLAINT/ REASON FOR VISIT:  Follow up for Essential thrombocytosis, MPL mutation positive.  Confirmed on bone marrow biopsy on 11/26/2023.  INTERVAL HISTORY:  Discussed the use of AI scribe software for clinical note transcription with the patient, who gave verbal consent to proceed.  History of Present Illness Yolanda Nicholson is a 79 year old female who presents with a urinary tract infection.  She has been experiencing symptoms of a urinary tract infection for some time before seeking medical attention on Monday. She is currently on a week-long course of antibiotics. She describes this UTI as unexpected and cannot recall the last occurrence, although she has had approximately four UTIs in the past.  She is currently taking hydroxyurea  500 mg once daily and baby aspirin. Recent blood work shows a normal white blood cell count of 6,500, normal hemoglobin at 12.1, and a slightly elevated platelet count of 427, up from 404 previously.  No fevers, chills, night sweats, cough, or trouble breathing. She also reports no abdominal pain and attributes recent weight loss to increased physical activity during a recent trip to [place].  She notes occasional pain behind her knees at night, which resolves with stretching. No swelling in the legs or redness.  She recently returned from a three-week trip to [place], where she visited several cities including [place], [place], [place], [place], and [place]. She reports being more physically active during the trip, which may  have contributed to her weight loss.   Yolanda Nicholson is a 79 year old female with essential thrombocythemia who presents for routine hematology follow-up to monitor disease status and treatment response.  She is currently treated with hydroxyurea  500 mg daily and low-dose aspirin. Platelet counts have decreased from a peak of 647 to 412. She has not experienced symptoms of thrombosis or bleeding. White blood cell count and hemoglobin are within normal limits. Liver enzymes are mildly elevated but stable.  She reports recent skin lesions, including one biopsied and diagnosed as a benign cyst, and another presenting as a deep red area that did not require intervention. No new or concerning skin changes have developed.  She had a urinary tract infection at her last visit, which has resolved. She denies ongoing genitourinary symptoms.  She notes stable appetite with some unintentional weight loss. She denies persistent nausea, vomiting, or abdominal pain. Occasional mild morning nausea occurs, which she attributes to taking medications with food and resolves spontaneously. Overall, she feels well.    SUMMARY OF HEMATOLOGIC HISTORY:  Routine labs at her PCPs office on 08/30/2023 showed platelet count of 682,000.  White count was 7800 with normal differential, hemoglobin 12.1.  Previously labs in September 2024 showed platelet count of 551,000 and labs in August 2024 showed platelet count of 685,000.  Given persistent thrombocytosis, referral was sent to us  for further evaluation.   In August 2024, the patient experienced a gastrointestinal illness that she suspected was C. diff, but tests were negative. This illness was followed by a bout of  diverticulitis, a condition the patient has experienced three times over the past few years. Since this time, the patient has reported a lack of energy and fatigue, which is unusual for her.   The patient is currently on cholesterol medication  and takes aspirin regularly.   She did not have prior splenectomy. She had no prior history or diagnosis of cancer. Her age appropriate screening programs are up-to-date. Patient has never been diagnosed with thrombotic events.   Chronic thrombocytosis with platelet counts ranging from 551,000 to 685,000 since August 2024.    Differential diagnosis includes essential thrombocytosis (ET), reactive thrombocytosis due to stress, infection, iron deficiency, or thyroid  abnormalities.    On her consultation with us  on 10/15/2023, labs showed persistent thrombocytosis but platelet count was slightly better at 588,000.  White count 8800 with normal differential.  Hemoglobin normal at 12.2, MCV 91.2.  CMP unremarkable.  LDH, Sed rate, CRP were all within normal limits.  Iron studies showed no evidence of iron deficiency.  Ferritin was borderline low at 11.   We pursued JAK2 mutation analysis with reflex testing to include CALR, MPL, exon 12-15 mutations to rule out primary myeloproliferative neoplasm.  JAK2 mutation was negative.  However reflex testing showed that she has MPL mutation positivity. We also checked BCR/ABL 1 and it was negative.  Since patient was traveling out of country from 10/28/2023 until 11/14/2023, bone marrow biopsy was obtained on 11/26/2023 once she returned.    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  Megakaryocytes were significantly increased in number with clustering and abnormal lobulation.  Conventional cytogenetics showed normal female karyotype, XX.  IPSET thrombosis score of 1, low risk.    On 12/08/2023, we started her on cytoreductive therapy with hydroxyurea  500 mg daily with goal to maintain platelet count below 400,000.  She was advised to continue aspirin 81 mg daily to prevent vasomotor symptoms.  I have reviewed the past medical history, past surgical history, social history and family  history with the patient and they are unchanged from previous note.  ALLERGIES: She is allergic to atorvastatin, betadine [povidone iodine], penicillins, and sulfa antibiotics.  MEDICATIONS:  Current Outpatient Medications  Medication Sig Dispense Refill   alendronate (FOSAMAX) 70 MG tablet Take 70 mg by mouth once a week.     aspirin EC 81 MG tablet Take 81 mg by mouth daily.     Cholecalciferol (VITAMIN D) 2000 UNITS tablet Take 2,000 Units by mouth daily.      ezetimibe (ZETIA) 10 MG tablet Take 10 mg by mouth daily.     hydroxyurea  (HYDREA ) 500 MG capsule Take 1 capsule (500 mg total) by mouth daily. May take with food to minimize GI side effects. 90 capsule 1   Multiple Vitamins-Minerals (PRESERVISION AREDS 2 PO) Take by mouth.     Propylene Glycol (SYSTANE BALANCE OP) Apply to eye.     rosuvastatin  (CRESTOR ) 20 MG tablet Take 20 mg by mouth daily.     tobramycin (TOBREX) 0.3 % ophthalmic solution      No current facility-administered medications for this visit.     REVIEW OF SYSTEMS:    Review of Systems - Oncology  All other pertinent systems were reviewed with the patient and are negative.  PHYSICAL EXAMINATION:   Onc Performance Status - 08/23/24 0908       ECOG Perf Status   ECOG Perf Status Fully active, able to carry on all pre-disease performance without restriction  KPS SCALE   KPS % SCORE Able to carry on normal activity, minor s/s of disease           Vitals:   08/23/24 0859  BP: (!) 149/81  Pulse: 79  Resp: 17  Temp: 97.7 F (36.5 C)  SpO2: 98%    Filed Weights   08/23/24 0859  Weight: 116 lb 11.2 oz (52.9 kg)     Physical Exam Constitutional:      General: She is not in acute distress.    Appearance: Normal appearance.  HENT:     Head: Normocephalic and atraumatic.  Eyes:     General: No scleral icterus.    Conjunctiva/sclera: Conjunctivae normal.  Cardiovascular:     Rate and Rhythm: Normal rate and regular rhythm.   Pulmonary:     Effort: Pulmonary effort is normal.  Abdominal:     General: There is no distension.  Musculoskeletal:     Right lower leg: No edema.     Left lower leg: No edema.  Neurological:     General: No focal deficit present.     Mental Status: She is alert and oriented to person, place, and time.  Psychiatric:        Mood and Affect: Mood normal.        Behavior: Behavior normal.        Thought Content: Thought content normal.      LABORATORY DATA:   I have reviewed the data as listed.  Results for orders placed or performed in visit on 08/23/24  CBC with Differential (Cancer Center Only)  Result Value Ref Range   WBC Count 9.3 4.0 - 10.5 K/uL   RBC 3.74 (L) 3.87 - 5.11 MIL/uL   Hemoglobin 12.2 12.0 - 15.0 g/dL   HCT 62.6 63.9 - 53.9 %   MCV 99.7 80.0 - 100.0 fL   MCH 32.6 26.0 - 34.0 pg   MCHC 32.7 30.0 - 36.0 g/dL   RDW 87.1 88.4 - 84.4 %   Platelet Count 412 (H) 150 - 400 K/uL   nRBC 0.0 0.0 - 0.2 %   Neutrophils Relative % 65 %   Neutro Abs 6.1 1.7 - 7.7 K/uL   Lymphocytes Relative 21 %   Lymphs Abs 1.9 0.7 - 4.0 K/uL   Monocytes Relative 12 %   Monocytes Absolute 1.1 (H) 0.1 - 1.0 K/uL   Eosinophils Relative 1 %   Eosinophils Absolute 0.1 0.0 - 0.5 K/uL   Basophils Relative 1 %   Basophils Absolute 0.1 0.0 - 0.1 K/uL   Immature Granulocytes 0 %   Abs Immature Granulocytes 0.04 0.00 - 0.07 K/uL  CMP (Cancer Center only)  Result Value Ref Range   Sodium 140 135 - 145 mmol/L   Potassium 3.8 3.5 - 5.1 mmol/L   Chloride 103 98 - 111 mmol/L   CO2 23 22 - 32 mmol/L   Glucose, Bld 124 (H) 70 - 99 mg/dL   BUN 17 8 - 23 mg/dL   Creatinine 9.44 9.55 - 1.00 mg/dL   Calcium  9.3 8.9 - 10.3 mg/dL   Total Protein 7.4 6.5 - 8.1 g/dL   Albumin 4.5 3.5 - 5.0 g/dL   AST 50 (H) 15 - 41 U/L   ALT 49 (H) 0 - 44 U/L   Alkaline Phosphatase 66 38 - 126 U/L   Total Bilirubin 0.5 0.0 - 1.2 mg/dL   GFR, Estimated >39 >39 mL/min   Anion gap 13 5 - 15  Lactate  dehydrogenase  Result Value Ref Range   LDH 181 105 - 235 U/L     RADIOGRAPHIC STUDIES:  No recent pertinent imaging available to review.  Orders Placed This Encounter  Procedures   CBC with Differential (Cancer Center Only)    Standing Status:   Future    Expiration Date:   08/23/2025   CMP (Cancer Center only)    Standing Status:   Future    Expiration Date:   08/23/2025   Lactate dehydrogenase    Standing Status:   Future    Expiration Date:   08/23/2025     Future Appointments  Date Time Provider Department Center  11/22/2024  8:30 AM [facility] None  11/22/2024  9:00 AM Alys Dulak, [place], MD [facility] None    This document was completed utilizing speech recognition software. Grammatical errors, random word insertions, pronoun errors, and incomplete sentences are an occasional consequence of this system due to software limitations, ambient noise, and hardware issues. Any formal questions or concerns about the content, text or information contained within the body of this dictation should be directly addressed to the provider for clarification.  "

## 2024-08-23 NOTE — Assessment & Plan Note (Addendum)
 Chronic thrombocytosis with platelet counts ranging from 551,000 to 685,000 since August 2024.    On her consultation with us  on 10/15/2023, labs showed persistent thrombocytosis but platelet count was slightly better at 588,000.  White count 8800 with normal differential.  Hemoglobin normal at 12.2, MCV 91.2.  CMP unremarkable.  LDH, Sed rate, CRP were all within normal limits.  Iron studies showed no evidence of iron deficiency.  Ferritin was borderline low at 11.   We pursued JAK2 mutation analysis with reflex testing to include CALR, MPL, exon 12-15 mutations to rule out primary myeloproliferative neoplasm.  JAK2 mutation was negative.  However reflex testing showed that she has MPL mutation positivity. We also checked BCR/ABL 1 and it was negative.  Since patient was traveling out of country from 10/28/2023 until 11/14/2023, bone marrow biopsy was obtained on 11/26/2023 once she returned.    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  Megakaryocytes were significantly increased in number with clustering and abnormal lobulation.  Conventional cytogenetics showed normal female karyotype, XX.  IPSET thrombosis score of 1, low risk.    Discussed the nature of essential thrombocythemia as a myeloproliferative neoplasm. Emphasized the importance of controlling cell division to prevent bone marrow exhaustion and scarring. Discussed potential symptoms to monitor, including unexplained infections, fevers, chills, night sweats, unusual bleeding, decreased appetite, low energy, and shortness of breath.  Platelet count was increased at 647,000 on 12/08/2023.  White count and hemoglobin remain within normal limits.  On 12/08/2023, we started her on cytoreductive therapy with hydroxyurea  with goal to maintain platelet count below 400,000.  Initially we started her on hydroxyurea  500 mg once daily.   She has been compliant with hydroxyurea   500 mg once daily and tolerating it well.  No major side effects.    Platelet count is slightly elevated at 412,000, but within acceptable range. No symptoms of thrombosis such as leg swelling, pain, redness, chest pain, or dyspnea. Current management with hydroxyurea  and aspirin is effective. No need to adjust hydroxyurea  dosing as white blood cell count and hemoglobin are normal.  Consider reducing hydroxyurea  dosage if hemoglobin drops below 10 or white blood cell count decreases significantly.  She was advised to continue aspirin 81 mg daily to prevent vasomotor symptoms and for clot prevention.  - Instructed her to obtain blood counts at her primary care visit in March.  RTC in 3 months for follow-up with repeat labs.

## 2024-11-22 ENCOUNTER — Inpatient Hospital Stay: Attending: Oncology

## 2024-11-22 ENCOUNTER — Inpatient Hospital Stay: Admitting: Oncology
